# Patient Record
Sex: Male | Born: 1975
Health system: Southern US, Community
[De-identification: ages and names within clinical notes are randomized; demographics above are authoritative.]

## PROBLEM LIST (undated history)

## (undated) DIAGNOSIS — W3400XA Accidental discharge from unspecified firearms or gun, initial encounter: Secondary | ICD-10-CM

## (undated) DIAGNOSIS — F419 Anxiety disorder, unspecified: Secondary | ICD-10-CM

## (undated) DIAGNOSIS — G43909 Migraine, unspecified, not intractable, without status migrainosus: Secondary | ICD-10-CM

## (undated) DIAGNOSIS — K219 Gastro-esophageal reflux disease without esophagitis: Secondary | ICD-10-CM

## (undated) DIAGNOSIS — F319 Bipolar disorder, unspecified: Secondary | ICD-10-CM

## (undated) DIAGNOSIS — F1110 Opioid abuse, uncomplicated: Secondary | ICD-10-CM

## (undated) DIAGNOSIS — F1010 Alcohol abuse, uncomplicated: Secondary | ICD-10-CM

## (undated) HISTORY — DX: Anxiety disorder, unspecified: F41.9

## (undated) HISTORY — DX: Migraine, unspecified, not intractable, without status migrainosus: G43.909

## (undated) HISTORY — PX: APPENDECTOMY: SHX54

## (undated) HISTORY — DX: Accidental discharge from unspecified firearms or gun, initial encounter: W34.00XA

## (undated) HISTORY — DX: Gastro-esophageal reflux disease without esophagitis: K21.9

## (undated) HISTORY — PX: ABDOMINAL SURGERY: SHX537

---

## 2000-06-04 ENCOUNTER — Emergency Department (HOSPITAL_COMMUNITY): Admission: EM | Admit: 2000-06-04 | Discharge: 2000-06-05 | Payer: Self-pay | Admitting: *Deleted

## 2000-06-05 ENCOUNTER — Ambulatory Visit (HOSPITAL_COMMUNITY): Admission: RE | Admit: 2000-06-05 | Discharge: 2000-06-06 | Payer: Self-pay | Admitting: Oral Surgery

## 2000-06-05 ENCOUNTER — Encounter: Payer: Self-pay | Admitting: *Deleted

## 2000-11-10 ENCOUNTER — Encounter: Payer: Self-pay | Admitting: Internal Medicine

## 2000-11-10 ENCOUNTER — Inpatient Hospital Stay (HOSPITAL_COMMUNITY): Admission: EM | Admit: 2000-11-10 | Discharge: 2000-11-14 | Payer: Self-pay | Admitting: Internal Medicine

## 2000-11-12 ENCOUNTER — Encounter: Payer: Self-pay | Admitting: Internal Medicine

## 2002-06-14 ENCOUNTER — Encounter: Payer: Self-pay | Admitting: Emergency Medicine

## 2002-06-14 ENCOUNTER — Emergency Department (HOSPITAL_COMMUNITY): Admission: EM | Admit: 2002-06-14 | Discharge: 2002-06-14 | Payer: Self-pay | Admitting: Emergency Medicine

## 2009-06-30 ENCOUNTER — Emergency Department (HOSPITAL_COMMUNITY): Admission: EM | Admit: 2009-06-30 | Discharge: 2009-07-01 | Payer: Self-pay | Admitting: Emergency Medicine

## 2010-04-18 LAB — GLUCOSE, CAPILLARY: Glucose-Capillary: 115 mg/dL — ABNORMAL HIGH (ref 70–99)

## 2010-04-18 LAB — URINALYSIS, ROUTINE W REFLEX MICROSCOPIC
Glucose, UA: NEGATIVE mg/dL
Hgb urine dipstick: NEGATIVE
Nitrite: NEGATIVE
Specific Gravity, Urine: 1.01 (ref 1.005–1.030)
Urobilinogen, UA: 0.2 mg/dL (ref 0.0–1.0)

## 2010-04-18 LAB — DIFFERENTIAL
Basophils Absolute: 0 10*3/uL (ref 0.0–0.1)
Basophils Relative: 0 % (ref 0–1)
Eosinophils Absolute: 0 10*3/uL (ref 0.0–0.7)
Neutro Abs: 5.8 10*3/uL (ref 1.7–7.7)
Neutrophils Relative %: 71 % (ref 43–77)

## 2010-04-18 LAB — CBC
MCHC: 34.4 g/dL (ref 30.0–36.0)
MCV: 88.6 fL (ref 78.0–100.0)
Platelets: 178 10*3/uL (ref 150–400)
RBC: 4.82 MIL/uL (ref 4.22–5.81)
RDW: 13.7 % (ref 11.5–15.5)

## 2010-04-18 LAB — ETHANOL: Alcohol, Ethyl (B): 50 mg/dL — ABNORMAL HIGH (ref 0–10)

## 2010-04-18 LAB — HEPATIC FUNCTION PANEL
AST: 25 U/L (ref 0–37)
Albumin: 4.2 g/dL (ref 3.5–5.2)
Total Protein: 7.6 g/dL (ref 6.0–8.3)

## 2010-04-18 LAB — BASIC METABOLIC PANEL
BUN: 12 mg/dL (ref 6–23)
CO2: 25 mEq/L (ref 19–32)
Calcium: 9.4 mg/dL (ref 8.4–10.5)
Creatinine, Ser: 1.05 mg/dL (ref 0.4–1.5)
GFR calc Af Amer: >60 mL/min (ref >60)
Glucose, Bld: 101 mg/dL — ABNORMAL HIGH (ref 70–99)

## 2010-04-18 LAB — RAPID URINE DRUG SCREEN, HOSP PERFORMED
Cocaine: NOT DETECTED
Tetrahydrocannabinol: POSITIVE — AB
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
## 2010-06-17 NOTE — Op Note (Signed)
[place]. [facility]  Patient:    Paul Ferguson, Paul Ferguson                    MRN: [id number] Proc. Date: 06/05/00 Adm. Date:  [id number] Attending:  Georgia Lopes                           Operative Report  PREOPERATIVE DIAGNOSIS:  Left mandibular angle fracture.  POSTOPERATIVE DIAGNOSIS:  Left mandibular angle fracture plus nonrestorable tooth #17.  OPERATION:  Open reduction and internal fixation left mandibular angle fracture.  Removal of tooth #17.  SURGEON:  Georgia Lopes, D.M.D.  ANESTHESIA:  General nasal  ANESTHESIOLOGIST:  Edwin Cap. Zoila Shutter, M.D.  DESCRIPTION OF PROCEDURE:  The patient was taken to the operating room and placed on the table in the supine position.  General anesthesia was administered intravenously and nasal endotracheal tube was placed and secured. Eyes were lubricated and protected.  The patient was prepped and draped for the procedure.  Then 0.25% Marcaine and 2% Lidocaine were administered in an inferior alveolar block on the left mandible.  A total of 2 carpules of Lidocaine and 2 carpules of Marcaine were used at 1.8 cc per carpule.  Then arch bars were adapted and placed and ligated to the upper and lower teeth with 24 gauge wires in the posterior and 26 gauge wires in the anterior.  It initially was thought that tooth #17, which was in the proximal segment of the fracture could be used to help secure the proximal segment but it was determined that this tooth was mesioangular in inclination and was partially impacted in the soft tissue when it was in its normal position in the mouth. It was therefore determined that this tooth could not be saved and would not be of help in reducing the fracture.  The tooth was then taken out using a Therapist, nutritional and cow horn forceps.  The socket was then inspected and all of the tooth fragments were removed.  There was a preexisting laceration of the lingula aspect of the soft tissue  at the fracture and this was then sutured with 4-0 chromic gut.  Then the oral cavity was irrigated copiously and the throat pack was removed and intermaxillary fixation was achieved using 24 and 26 gauge wires to wire the upper to the lower jaw using the previously attached arch bars.  Occlusion was satisfactory at this time.  Then attention was turned to the extra oral area of the left mandible.  Gloves were changed and the area was undraped to provide sterile field.  Although this fracture did communicate with the oral solutions via its intraoral exposure through the fracture site.  A marking pen was used to demarcate the inferior border which was palpated and the fracture site which was palpated.  Then 2 carpules of 2% Lidocaine and 1:100,000 epinephrine and 1 carpule of Marcaine was used to infiltrate subcutaneously and down to the bone.  Then a #15 blade was used to incise first through skin and subcutaneous tissue. Bovie electrocautery was used for hemostasis and the platysma muscle was identified and a nerve stimulator was used to detect for the facial nerve.  It was not found in this plane and in this incision.  This muscle was then suctioned with a 15 blade. The dissection was carried deeper until the inferior border of the mandible was found and the fracture was identified.  A  key periosteal elevator and a Therapist, nutritional were used to expose the fracture.  Kocher bone clamps were then used to reduce the fracture and it reduced nicely into anatomical position. Then Osteomed bone plate was used four hole compression plate and was adapted to the fracture site.  Drill was then used under irrigation to create four holes in the mandible and 8 mm screws were placed.  The fracture was satisfactorily reduced at this point and the area was irrigated and then closed with 3-0 Vicryl, 3-0 chromic deep and muscular layers and then a subcuticular suture was used with 3-0 chromic.  The skin was  closed with 5-0 nylon.  Bacitracin was then placed.  Telfa and then Hypafix as then placed on the incision area.  An NG tube was placed.  The oral cavity was inspected and suctioned.  The patient was awakened and taken to the recovery room breathing spontaneously in good condition.  EBL was 50 cc.  Complications were none. Specimens none.  Counts were correct. DD:  06/05/00 TD:  06/06/00 Job: [id number]

## 2010-06-17 NOTE — H&P (Signed)
[facility]  Patient:    Paul Ferguson, Paul Ferguson Visit Number: [id number] MRN: [id number]          Service Type: MED Location: [facility] [id number] 01 Attending Physician:  Cassell Smiles. Dictated by:   Avon Gully, M.D. Admit Date:  11/10/2000                           History and Physical  CHIEF COMPLAINT:  Drug overdose.  HISTORY OF PRESENT ILLNESS:  This is a 35 year old white male with a history of paranoid schizophrenia, brought to the emergency room due to drug overdose. According this his girlfriend, the patient was in his usual state of health until the day of admission when she found him unresponsive and in respiratory distress.  The patient was on Zyprexa for paranoid schizophrenia.  His girlfriend found his Zyprexa bottle, which has 45 tablets of Zyprexa, nearby the patient, empty.  Then EMS was called, and the patient was rushed to the emergency room.  In the emergency room the patient was found to be sedated and unresponsive to verbal stimulus.  He did have a gag reflex, and the decision was made to intubate him.  The patient was then intubated and was admitted to ICU.  REVIEW OF SYSTEMS:  The patient is intubated and sedated.  He is unable to give any history.  PAST MEDICAL HISTORY: 1. Paranoid schizophrenia. 2. History of fall with fracture of mandible.  CURRENT MEDICATIONS:  Zyprexa.  Prozac.  SOCIAL HISTORY:  The patient is single.  He is disabled due to his mental illness.  The patient smokes cigarettes.  He has a history of alcohol on a social basis.  No history of substance abuse.  PHYSICAL EXAMINATION:  GENERAL:  The patient is intubated and sedated.  HEENT:  Pupils equal and reactive.  NECK:  Supple.  LUNGS:  Clear lung fields.  Good air entry.  CARDIOVASCULAR:  First and second heart sounds heard.  No murmur, no gallop.  ABDOMEN:  Soft and flat.  Bowel sounds positive.  No mass, no organomegaly.  EXTREMITIES:  No leg  edema.  ASSESSMENT:  Respiratory distress secondary to drug overdose, most probably Zyprexa.  PLAN:  Will continue the patient on ventilatory support.  Will start patient on IV fluids.  Will monitor his blood gas and adjust his ventilator setting. Will do pulmonary consult.  Will get behavioral medicine consult when the patient is alert and awake. Dictated by:   Avon Gully, M.D. Attending Physician:  Cassell Smiles DD:  11/11/00 TD:  11/11/00 Job: [id number]

## 2010-06-17 NOTE — Group Therapy Note (Signed)
[facility]  Patient:    Paul Ferguson, Paul Ferguson Visit Number: [id number] MRN: [id number]          Service Type: MED Location: [facility] [id number] 01 Attending Physician:  Cassell Smiles. Dictated by:   Kari Baars, M.D. Admit Date:  11/10/2000                               Progress Note  Mr. Vallery is still having some cough. Otherwise he is doing about the same. He has no other new complaints. Apparently plans are under way for him to receive increased psychiatric help possibly as an inpatient. Since he is off the ventilator and has what appears to be a bronchitis, I am going to go ahead and sign off at this point. Thanks again for allowing me to see him with you. Dictated by:   Kari Baars, M.D. Attending Physician:  Cassell Smiles DD:  11/13/00 TD:  11/13/00 Job: [phone] [id number]

## 2010-06-17 NOTE — Discharge Summary (Signed)
[facility]  Patient:    Paul Ferguson, Paul Ferguson Visit Number: [id number] MRN: [id number]          Service Type: MED Location: 2A A220 01 Attending Physician:  Avon Gully Dictated by:   Avon Gully, M.D. Admit Date:  11/10/2000 Discharge Date: 11/14/2000                             Discharge Summary  DISCHARGE DIAGNOSES: 1. Ventilatory dependent respiratory failure. 2. Status post extubation. 3. Drug overdose with Zyprexa. 4. History of paranoid schizophrenia. 5. History of suicidal ideation.  DISPOSITION:  The patient was discharged home in stable condition after arrangement was made to be followed by mental health department on discharge.  HOSPITAL COURSE: This is a 35 year old white male with a history of paranoid schizophrenia.  He was brought to the emergency room due to drug overdose. The patient took an unspecified amount of Zyprexa.  When he was brought to the emergency room he was unconscious and had difficulty breathing.  He was immediately intubated in the emergency room and was put on mechanical ventilation.  The patient was admitted to ICU where he was maintained on mechanical ventilation.  Later on the patient when his overall condition improved the patient was successfully extubated.  Psychiatric consult was made and the patient was evaluated by mental health nurse practitioner who consulted her psychiatrist and determined that the patient could be discharged with a follow up with the Mental Health Department.  The patient was discharged home in stable condition to continue his follow up with the Department of Health. Dictated by:   Avon Gully, M.D. Attending Physician:  Avon Gully DD:  12/04/00 TD:  12/05/00 Job: [id number]

## 2010-06-17 NOTE — Consult Note (Signed)
[facility]  Patient:    Paul Ferguson, Paul Ferguson Visit Number: [id number] MRN: [id number]          Service Type: MED Location: [facility] [id number] 01 Attending Physician:  Cassell Smiles. Dictated by:   Kari Baars, M.D. Admit Date:  11/10/2000                            Consultation Report  PRIMARY CARE PHYSICIAN:  Avon Gully, M.D.  REASON FOR CONSULTATION:  Respiratory failure.  HISTORY OF PRESENT ILLNESS:  This is a 35 year old who has had a history of paranoid schizophrenia and who had been brought in unresponsive of a drug overdose apparently. He had been on zyprexa and apparently had taken at least 45 of these. When he was brought to the emergency room, he was unresponsive, had a poor gag reflex and was intubated. He was then taken to the intensive care unit and since then he has gotten somewhat better. He is really not able to give any history at this point. He does have a history of paranoid schizophrenia. His medications are zyprexa, I am not sure of the dose, and Prozac. He has been disabled because of his mental illness and smokes about a package of cigarettes daily. Apparently there is some alcohol use but I do not know the extent of this.  REVIEW OF SYSTEMS:  Really unobtainable now.  PHYSICAL EXAMINATION:  GENERAL:  He is alert, motions that he wants the endotracheal tube out.  VITAL SIGNS:  His respirations are about 18, blood pressure 122/80, pulse 80.  HEENT:  Shows that he has and endotracheal tube in his mouth. Nose and throat are clear. Pupils are reactive. Tympanic membranes are intact.  NECK:  Supple without masses.  CHEST:  Clear without wheezes, rales or rhonchi.  HEART:  Regular without murmur, gallop or rub.  ABDOMEN:  Soft  EXTREMITIES:  Showed no edema.  CNS:  He is alert and awake.  PLAN:  Go ahead and I think he is probably ready for extubation as Dr. Felecia Shelling has already discussed with me and I plan to follow with  you. It appears that he is going to need further mental health evaluation and treatment. Dictated by:   Kari Baars, M.D. Attending Physician:  Cassell Smiles DD:  11/12/00 TD:  11/13/00 Job: [phone] [id number]

## 2012-03-17 ENCOUNTER — Emergency Department (HOSPITAL_COMMUNITY): Payer: Self-pay

## 2012-03-17 ENCOUNTER — Encounter (HOSPITAL_COMMUNITY): Payer: Self-pay

## 2012-03-17 ENCOUNTER — Emergency Department (HOSPITAL_COMMUNITY)
Admission: EM | Admit: 2012-03-17 | Discharge: 2012-03-17 | Disposition: A | Discharge status: Home / Self Care | Payer: Self-pay | Attending: Emergency Medicine | Admitting: Emergency Medicine

## 2012-03-17 DIAGNOSIS — R5381 Other malaise: Secondary | ICD-10-CM | POA: Insufficient documentation

## 2012-03-17 DIAGNOSIS — F29 Unspecified psychosis not due to a substance or known physiological condition: Secondary | ICD-10-CM | POA: Insufficient documentation

## 2012-03-17 DIAGNOSIS — R5383 Other fatigue: Secondary | ICD-10-CM | POA: Insufficient documentation

## 2012-03-17 DIAGNOSIS — F17200 Nicotine dependence, unspecified, uncomplicated: Secondary | ICD-10-CM | POA: Insufficient documentation

## 2012-03-17 DIAGNOSIS — Z7982 Long term (current) use of aspirin: Secondary | ICD-10-CM | POA: Insufficient documentation

## 2012-03-17 DIAGNOSIS — R209 Unspecified disturbances of skin sensation: Secondary | ICD-10-CM | POA: Insufficient documentation

## 2012-03-17 DIAGNOSIS — R110 Nausea: Secondary | ICD-10-CM | POA: Insufficient documentation

## 2012-03-17 DIAGNOSIS — R569 Unspecified convulsions: Secondary | ICD-10-CM | POA: Insufficient documentation

## 2012-03-17 DIAGNOSIS — R51 Headache: Secondary | ICD-10-CM | POA: Insufficient documentation

## 2012-03-17 DIAGNOSIS — H9319 Tinnitus, unspecified ear: Secondary | ICD-10-CM | POA: Insufficient documentation

## 2012-03-17 DIAGNOSIS — Z8659 Personal history of other mental and behavioral disorders: Secondary | ICD-10-CM | POA: Insufficient documentation

## 2012-03-17 LAB — COMPREHENSIVE METABOLIC PANEL
ALT: 22 U/L (ref 0–53)
AST: 26 U/L (ref 0–37)
Alkaline Phosphatase: 65 U/L (ref 39–117)
CO2: 21 mEq/L (ref 19–32)
Chloride: 102 mEq/L (ref 96–112)
GFR calc non Af Amer: 74 mL/min — ABNORMAL LOW (ref >90)
Potassium: 3.9 mEq/L (ref 3.5–5.1)
Sodium: 137 mEq/L (ref 135–145)
Total Bilirubin: 0.6 mg/dL (ref 0.3–1.2)

## 2012-03-17 LAB — RAPID URINE DRUG SCREEN, HOSP PERFORMED
Amphetamines: NOT DETECTED
Benzodiazepines: POSITIVE — AB
Tetrahydrocannabinol: POSITIVE — AB

## 2012-03-17 LAB — URINALYSIS, ROUTINE W REFLEX MICROSCOPIC
Bilirubin Urine: NEGATIVE
Hgb urine dipstick: NEGATIVE
Protein, ur: NEGATIVE mg/dL
Urobilinogen, UA: 0.2 mg/dL (ref 0.0–1.0)

## 2012-03-17 LAB — CBC WITH DIFFERENTIAL/PLATELET
Basophils Absolute: 0 10*3/uL (ref 0.0–0.1)
HCT: 41.6 % (ref 39.0–52.0)
Lymphocytes Relative: 25 % (ref 12–46)
Monocytes Absolute: 0.6 10*3/uL (ref 0.1–1.0)
Neutro Abs: 6.3 10*3/uL (ref 1.7–7.7)
Platelets: 208 10*3/uL (ref 150–400)
RDW: 13.1 % (ref 11.5–15.5)
WBC: 9.4 10*3/uL (ref 4.0–10.5)

## 2012-03-17 MED ORDER — DIAZEPAM 10 MG RE GEL: 5.0000 mg | Freq: Once | RECTAL | Status: DC

## 2012-03-17 MED ORDER — KETOROLAC TROMETHAMINE 30 MG/ML IJ SOLN
30.0000 mg | Freq: Once | INTRAMUSCULAR | Status: AC
  Administered 2012-03-17: 30 mg via INTRAVENOUS
  Filled 2012-03-17: qty 1

## 2012-03-17 MED ORDER — LEVETIRACETAM 500 MG PO TABS: 500.0000 mg | ORAL_TABLET | Freq: Two times a day (BID) | ORAL | Status: DC

## 2012-03-17 MED ORDER — ACETAMINOPHEN 500 MG PO TABS
1000.0000 mg | ORAL_TABLET | Freq: Once | ORAL | Status: AC
  Administered 2012-03-17: 1000 mg via ORAL
  Filled 2012-03-17: qty 2

## 2012-03-17 MED ORDER — LORAZEPAM 2 MG/ML IJ SOLN
1.0000 mg | Freq: Once | INTRAMUSCULAR | Status: AC
  Administered 2012-03-17: 1 mg via INTRAVENOUS
  Filled 2012-03-17: qty 1

## 2012-03-17 MED ORDER — SODIUM CHLORIDE 0.9 % IV SOLN
500.0000 mg | Freq: Once | INTRAVENOUS | Status: AC
  Administered 2012-03-17: 500 mg via INTRAVENOUS
  Filled 2012-03-17: qty 5
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
## 2012-03-17 NOTE — ED Notes (Signed)
Family states pt had ? Three seizures yesterday and one today

## 2012-03-17 NOTE — ED Notes (Signed)
Sprite and crackers given to pt 

## 2012-03-17 NOTE — ED Notes (Addendum)
Seizure pads placed on bed at pt arrival. SO at bedside.

## 2012-03-17 NOTE — ED Provider Notes (Signed)
37 year old male with past medical history significant for bipolar and anxiety who was treated with benzodiazepines as of September. He has not had his medicines in many months, he drinks alcohol occasionally, he did drink some alcohol last night and had a Klonopin last night. His girlfriend with whom he was spending the night with states that he had 3 discreet seizure-like episodes last night, one this morning. Each of these was punctuated by complete arm and leg body shaking and tightness almost as if he was in a locked position as well as biting of the right lower lip and a postictal phase lasting 30 minutes or more. His last episode was this morning, he has not had any urinary incontinence, he does have a significant headache which is diffuse at this time. The patient denies any other prescription or over-the-counter medications other than the occasional aspirin, there has been no head injuries other than a very small abrasion to his right face after he fell during a seizure last night. He denies numbness or weakness of his arms or legs, he was able to ambulate in the hospital. The patient and his girlfriend state that they have a child that had epilepsy which has since passed away.  On exam the patient appears mildly somnolent but easily arousable, follows commands, speech is clear, memory is intact except for the episodes of seizure activity, strength is normal in all 4 extremities, cranial nerves III through XII are intact, no pronator drift.  Assessment: The patient has had what appears to be a new seizure disorder, cause unspecified at this time. He will require workup including CT scan of the head, laboratory evaluation.  Labs negative, CT negative - Neuro never responded to page - pt stable for d/c - girlfriend states will ensure that he gets the f/u, keppra given in ED, home with same and diastat for breakthrough.  Medical screening examination/treatment/procedure(s) were conducted as a shared  visit with non-physician practitioner(s) and myself.  I personally evaluated the patient during the encounter    Vida Roller, MD 03/17/12 [phone]

## 2012-03-19 NOTE — ED Provider Notes (Signed)
Medical screening examination/treatment/procedure(s) were conducted as a shared visit with non-physician practitioner(s) and myself.  I personally evaluated the patient during the encounter  Please see my separate respective documentation pertaining to this patient encounter   Vida Roller, MD 03/19/12 [phone]

## 2013-08-14 ENCOUNTER — Encounter (HOSPITAL_COMMUNITY): Payer: Self-pay | Admitting: Emergency Medicine

## 2013-08-14 ENCOUNTER — Emergency Department (HOSPITAL_COMMUNITY)
Admission: EM | Admit: 2013-08-14 | Discharge: 2013-08-14 | Disposition: A | Discharge status: Home / Self Care | Payer: No Typology Code available for payment source | Attending: Emergency Medicine | Admitting: Emergency Medicine

## 2013-08-14 ENCOUNTER — Emergency Department (HOSPITAL_COMMUNITY): Payer: No Typology Code available for payment source

## 2013-08-14 DIAGNOSIS — I951 Orthostatic hypotension: Secondary | ICD-10-CM

## 2013-08-14 DIAGNOSIS — R42 Dizziness and giddiness: Secondary | ICD-10-CM | POA: Insufficient documentation

## 2013-08-14 DIAGNOSIS — R519 Headache, unspecified: Secondary | ICD-10-CM

## 2013-08-14 DIAGNOSIS — R51 Headache: Secondary | ICD-10-CM | POA: Insufficient documentation

## 2013-08-14 DIAGNOSIS — F17200 Nicotine dependence, unspecified, uncomplicated: Secondary | ICD-10-CM | POA: Insufficient documentation

## 2013-08-14 DIAGNOSIS — R55 Syncope and collapse: Secondary | ICD-10-CM | POA: Insufficient documentation

## 2013-08-14 DIAGNOSIS — Z7982 Long term (current) use of aspirin: Secondary | ICD-10-CM | POA: Insufficient documentation

## 2013-08-14 LAB — CBC WITH DIFFERENTIAL/PLATELET
BASOS PCT: 0 % (ref 0–1)
Basophils Absolute: 0 10*3/uL (ref 0.0–0.1)
EOS PCT: 1 % (ref 0–5)
Eosinophils Absolute: 0.1 10*3/uL (ref 0.0–0.7)
HEMATOCRIT: 40.1 % (ref 39.0–52.0)
HEMOGLOBIN: 13.6 g/dL (ref 13.0–17.0)
Lymphocytes Relative: 24 % (ref 12–46)
Lymphs Abs: 1.9 10*3/uL (ref 0.7–4.0)
MCH: 30.8 pg (ref 26.0–34.0)
MCHC: 33.9 g/dL (ref 30.0–36.0)
MCV: 90.7 fL (ref 78.0–100.0)
MONO ABS: 0.7 10*3/uL (ref 0.1–1.0)
MONOS PCT: 9 % (ref 3–12)
NEUTROS ABS: 5 10*3/uL (ref 1.7–7.7)
Neutrophils Relative %: 66 % (ref 43–77)
Platelets: 197 10*3/uL (ref 150–400)
RBC: 4.42 MIL/uL (ref 4.22–5.81)
RDW: 13.6 % (ref 11.5–15.5)
WBC: 7.7 10*3/uL (ref 4.0–10.5)

## 2013-08-14 LAB — BASIC METABOLIC PANEL
Anion gap: 14 (ref 5–15)
BUN: 13 mg/dL (ref 6–23)
CALCIUM: 9 mg/dL (ref 8.4–10.5)
CHLORIDE: 106 meq/L (ref 96–112)
CO2: 23 meq/L (ref 19–32)
CREATININE: 1.01 mg/dL (ref 0.50–1.35)
GFR calc non Af Amer: >90 mL/min (ref >90)
Glucose, Bld: 103 mg/dL — ABNORMAL HIGH (ref 70–99)
Potassium: 4.3 mEq/L (ref 3.7–5.3)
Sodium: 143 mEq/L (ref 137–147)

## 2013-08-14 LAB — ETHANOL: Alcohol, Ethyl (B): <11 mg/dL (ref 0–11)

## 2013-08-14 MED ORDER — SODIUM CHLORIDE 0.9 % IV SOLN: 1000.0000 mL | INTRAVENOUS | Status: DC

## 2013-08-14 MED ORDER — SODIUM CHLORIDE 0.9 % IV SOLN
1000.0000 mL | Freq: Once | INTRAVENOUS | Status: AC
  Administered 2013-08-14: 1000 mL via INTRAVENOUS

## 2013-08-14 MED ORDER — MECLIZINE HCL 12.5 MG PO TABS
25.0000 mg | ORAL_TABLET | Freq: Once | ORAL | Status: AC
  Administered 2013-08-14: 25 mg via ORAL
  Filled 2013-08-14: qty 2

## 2013-08-14 MED ORDER — DIPHENHYDRAMINE HCL 50 MG/ML IJ SOLN
25.0000 mg | Freq: Once | INTRAMUSCULAR | Status: AC
  Administered 2013-08-14: 25 mg via INTRAVENOUS
  Filled 2013-08-14: qty 1

## 2013-08-14 MED ORDER — MECLIZINE HCL 25 MG PO TABS: 25.0000 mg | ORAL_TABLET | Freq: Three times a day (TID) | ORAL | Status: AC | PRN

## 2013-08-14 MED ORDER — METOCLOPRAMIDE HCL 5 MG/ML IJ SOLN
10.0000 mg | Freq: Once | INTRAMUSCULAR | Status: AC
  Administered 2013-08-14: 10 mg via INTRAVENOUS
  Filled 2013-08-14: qty 2

## 2013-08-14 NOTE — Discharge Instructions (Signed)
Your CAT scan did not show any new injury. You may take acetaminophen or ibuprofen as needed for pain. I recommend that she get the prescriptions filled that you were given from [facility].  Dizziness Dizziness is a common problem. It is a feeling of unsteadiness or light-headedness. You may feel like you are about to faint. Dizziness can lead to injury if you stumble or fall. A person of any age group can suffer from dizziness, but dizziness is more common in older adults. CAUSES  Dizziness can be caused by many different things, including:  Middle ear problems.  Standing for too long.  Infections.  An allergic reaction.  Aging.  An emotional response to something, such as the sight of blood.  Side effects of medicines.  Tiredness.  Problems with circulation or blood pressure.  Excessive use of alcohol or medicines, or illegal drug use.  Breathing too fast (hyperventilation).  An irregular heart rhythm (arrhythmia).  A low red blood cell count (anemia).  Pregnancy.  Vomiting, diarrhea, fever, or other illnesses that cause body fluid loss (dehydration).  Diseases or conditions such as Parkinson's disease, high blood pressure (hypertension), diabetes, and thyroid problems.  Exposure to extreme heat. DIAGNOSIS  Your health care provider will ask about your symptoms, perform a physical exam, and perform an electrocardiogram (ECG) to record the electrical activity of your heart. Your health care provider may also perform other heart or blood tests to determine the cause of your dizziness. These may include:  Transthoracic echocardiogram (TTE). During echocardiography, sound waves are used to evaluate how blood flows through your heart.  Transesophageal echocardiogram (TEE).  Cardiac monitoring. This allows your health care provider to monitor your heart rate and rhythm in real time.  Holter monitor. This is a portable device that records your heartbeat and can help  diagnose heart arrhythmias. It allows your health care provider to track your heart activity for several days if needed.  Stress tests by exercise or by giving medicine that makes the heart beat faster. TREATMENT  Treatment of dizziness depends on the cause of your symptoms and can vary greatly. HOME CARE INSTRUCTIONS   Drink enough fluids to keep your urine clear or pale yellow. This is especially important in very hot weather. In older adults, it is also important in cold weather.  Take your medicine exactly as directed if your dizziness is caused by medicines. When taking blood pressure medicines, it is especially important to get up slowly.  Rise slowly from chairs and steady yourself until you feel okay.  In the morning, first sit up on the side of the bed. When you feel okay, stand slowly while holding onto something until you know your balance is fine.  Move your legs often if you need to stand in one place for a long time. Tighten and relax your muscles in your legs while standing.  Have someone stay with you for 1-2 days if dizziness continues to be a problem. Do this until you feel you are well enough to stay alone. Have the person call your health care provider if he or she notices changes in you that are concerning.  Do not drive or use heavy machinery if you feel dizzy.  Do not drink alcohol. SEEK IMMEDIATE MEDICAL CARE IF:   Your dizziness or light-headedness gets worse.  You feel nauseous or vomit.  You have problems talking, walking, or using your arms, hands, or legs.  You feel weak.  You are not thinking clearly or  you have trouble forming sentences. It may take a friend or family member to notice this.  You have chest pain, abdominal pain, shortness of breath, or sweating.  Your vision changes.  You notice any bleeding.  You have side effects from medicine that seems to be getting worse rather than better. MAKE SURE YOU:   Understand these  instructions.  Will watch your condition.  Will get help right away if you are not doing well or get worse. Document Released: 07/12/2000 Document Revised: 01/21/2013 Document Reviewed: 08/05/2010 [facility] Patient Information 2015 [place], [place]. This information is not intended to replace advice given to you by your health care provider. Make sure you discuss any questions you have with your health care provider.  Meclizine tablets or capsules What is this medicine? MECLIZINE (MEK li zeen) is an antihistamine. It is used to prevent nausea, vomiting, or dizziness caused by motion sickness. It is also used to prevent and treat vertigo (extreme dizziness or a feeling that you or your surroundings are tilting or spinning around). This medicine may be used for other purposes; ask your health care provider or pharmacist if you have questions. COMMON BRAND NAME(S): Antivert, Dramamine Less Drowsy, Medivert, Meni-D What should I tell my health care provider before I take this medicine? They need to know if you have any of these conditions: -asthma -glaucoma -prostate trouble -stomach problems -urinary problems -an unusual or allergic reaction to meclizine, other medicines, foods, dyes, or preservatives -pregnant or trying to get pregnant -breast-feeding How should I use this medicine? Take this medicine by mouth with a glass of water. Follow the directions on the prescription label. If you are using this medicine to prevent motion sickness, take the dose at least 1 hour before travel. If it upsets your stomach, take it with food or milk. Take your doses at regular intervals. Do not take your medicine more often than directed. Talk to your pediatrician regarding the use of this medicine in children. Special care may be needed. Overdosage: If you think you have taken too much of this medicine contact a poison control center or emergency room at once. NOTE: This medicine is only for you. Do not  share this medicine with others. What if I miss a dose? If you miss a dose, take it as soon as you can. If it is almost time for your next dose, take only that dose. Do not take double or extra doses. What may interact with this medicine? -barbiturate medicines for inducing sleep or treating seizures -digoxin -medicines for anxiety or sleeping problems, like alprazolam, diazepam or temazepam -medicines for hay fever and other allergies -medicines for mental depression -medicines for movement abnormalities as in Parkinson's disease, or for stomach problems -medicines for pain -medicines that relax muscles This list may not describe all possible interactions. Give your health care provider a list of all the medicines, herbs, non-prescription drugs, or dietary supplements you use. Also tell them if you smoke, drink alcohol, or use illegal drugs. Some items may interact with your medicine. What should I watch for while using this medicine? If you are taking this medicine on a regular schedule, visit your doctor or health care professional for regular checks on your progress. You may get dizzy, drowsy or have blurred vision. Do not drive, use machinery, or do anything that needs mental alertness until you know how this medicine affects you. Do not stand or sit up quickly, especially if you are an older patient. This reduces the risk  of dizzy or fainting spells. Alcohol can increase possible dizziness. Avoid alcoholic drinks. Your mouth may get dry. Chewing sugarless gum or sucking hard candy, and drinking plenty of water may help. Contact your doctor if the problem does not go away or is severe. This medicine may cause dry eyes and blurred vision. If you wear contact lenses you may feel some discomfort. Lubricating drops may help. See your eye doctor if the problem does not go away or is severe. What side effects may I notice from receiving this medicine? Side effects that you should report to your  doctor or health care professional as soon as possible: -fainting spells -fast or irregular heartbeat Side effects that usually do not require medical attention (report to your doctor or health care professional if they continue or are bothersome): -constipation -difficulty passing urine -difficulty sleeping -headache -stomach upset This list may not describe all possible side effects. Call your doctor for medical advice about side effects. You may report side effects to FDA at 1-800-FDA-1088. Where should I keep my medicine? Keep out of the reach of children. Store at room temperature between 15 and 30 degrees C (59 and 86 degrees F). Keep container tightly closed. Throw away any unused medicine after the expiration date. NOTE: This sheet is a summary. It may not cover all possible information. If you have questions about this medicine, talk to your doctor, pharmacist, or health care provider.  2015, Elsevier/Gold Standard. (2007-07-25 10:35:36)
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
## 2013-08-14 NOTE — ED Notes (Signed)
Patient with no complaints at this time. Respirations even and unlabored. Skin warm/dry. Discharge instructions reviewed with patient at this time. Patient given opportunity to voice concerns/ask questions. IV removed per policy and band-aid applied to site. Patient discharged at this time and left Emergency Department with steady gait.  

## 2013-08-14 NOTE — ED Notes (Signed)
Patient appears anxious and agitated. Shaking. States these symptoms started this AM. Hypertensive.

## 2016-04-25 ENCOUNTER — Emergency Department (HOSPITAL_COMMUNITY)
Admission: EM | Admit: 2016-04-25 | Discharge: 2016-04-25 | Disposition: A | Discharge status: Home / Self Care | Payer: Self-pay | Attending: Dermatology | Admitting: Dermatology

## 2016-04-25 DIAGNOSIS — Z5321 Procedure and treatment not carried out due to patient leaving prior to being seen by health care provider: Secondary | ICD-10-CM | POA: Insufficient documentation

## 2016-04-25 DIAGNOSIS — L0291 Cutaneous abscess, unspecified: Secondary | ICD-10-CM | POA: Insufficient documentation

## 2016-04-25 DIAGNOSIS — F17200 Nicotine dependence, unspecified, uncomplicated: Secondary | ICD-10-CM | POA: Insufficient documentation

## 2016-04-25 NOTE — ED Notes (Signed)
Called for triage for second time. No patients in the waiting room or bathroom. Registration does not see patient.

## 2016-10-06 ENCOUNTER — Other Ambulatory Visit: Payer: Self-pay

## 2016-10-06 ENCOUNTER — Emergency Department (HOSPITAL_COMMUNITY): Payer: Medicare Other

## 2016-10-06 ENCOUNTER — Emergency Department (HOSPITAL_COMMUNITY)
Admission: EM | Admit: 2016-10-06 | Discharge: 2016-10-07 | Discharge status: Against Medical Advice | Payer: Medicare Other | Attending: Emergency Medicine | Admitting: Emergency Medicine

## 2016-10-06 ENCOUNTER — Encounter (HOSPITAL_COMMUNITY): Payer: Self-pay | Admitting: Emergency Medicine

## 2016-10-06 DIAGNOSIS — R079 Chest pain, unspecified: Secondary | ICD-10-CM | POA: Diagnosis not present

## 2016-10-06 DIAGNOSIS — R0602 Shortness of breath: Secondary | ICD-10-CM | POA: Diagnosis not present

## 2016-10-06 DIAGNOSIS — R0789 Other chest pain: Secondary | ICD-10-CM | POA: Diagnosis not present

## 2016-10-06 DIAGNOSIS — Z5320 Procedure and treatment not carried out because of patient's decision for unspecified reasons: Secondary | ICD-10-CM | POA: Insufficient documentation

## 2016-10-06 DIAGNOSIS — F17200 Nicotine dependence, unspecified, uncomplicated: Secondary | ICD-10-CM | POA: Insufficient documentation

## 2016-10-06 MED ORDER — LORAZEPAM 1 MG PO TABS
1.0000 mg | ORAL_TABLET | Freq: Once | ORAL | Status: AC
  Administered 2016-10-06: 1 mg via ORAL
  Filled 2016-10-06: qty 1

## 2016-10-06 MED ORDER — ASPIRIN 81 MG PO CHEW
324.0000 mg | CHEWABLE_TABLET | Freq: Once | ORAL | Status: AC
  Administered 2016-10-06: 324 mg via ORAL
  Filled 2016-10-06: qty 4

## 2016-10-06 MED ORDER — KETOROLAC TROMETHAMINE 30 MG/ML IJ SOLN
30.0000 mg | Freq: Once | INTRAMUSCULAR | Status: AC
  Administered 2016-10-07: 30 mg via INTRAVENOUS
  Filled 2016-10-06: qty 1

## 2016-10-06 NOTE — ED Triage Notes (Signed)
Pt c/o left chest pain that radiates down left arm x 2 days.

## 2016-10-06 NOTE — ED Notes (Signed)
Pt states he does not need to urinate at this time.  

## 2016-10-06 NOTE — ED Provider Notes (Signed)
[facility]-EMERGENCY DEPT Provider Note   CSN: [id number] Arrival date & time: 10/06/16  2256     History   Chief Complaint Chief Complaint  Patient presents with  . Chest Pain    HPI Paul Ferguson is a 41 y.o. male.  Patient reports left-sided chest pain that has been intermittent over the past 3 days. States this pain came on tonight about 8 PM after he woke up from a nap. Pain is sharp, constant, radiates down his left arm. It is worse when he moves his left arm across his chest. It is better when he places his hand on his chest. He did not take anything for it at home. He feels short of breath with it but denies any nausea, vomiting or diaphoresis.He states he's had this intermittent pain 2 days ago as well as a last of the majority of the day. It improved when a friend gave him a "nerve pill". But the pain returned again tonight. Denies any cardiac history. He is smoker. Does not take any chronic medications. Denies any cocaine abuse but does use marijuana.   The history is provided by the patient.  Chest Pain   Associated symptoms include shortness of breath. Pertinent negatives include no abdominal pain, no dizziness, no fever, no headaches, no nausea, no vomiting and no weakness.    History reviewed. No pertinent past medical history.  There are no active problems to display for this patient.   Past Surgical History:  Procedure Laterality Date  . ABDOMINAL SURGERY    . APPENDECTOMY         Home Medications    Prior to Admission medications   Medication Sig Start Date End Date Taking? Authorizing Provider  acetaminophen (TYLENOL) 500 MG tablet Take 500 mg by mouth every 6 (six) hours as needed.    [provider]  meclizine (ANTIVERT) 25 MG tablet Take 1 tablet (25 mg total) by mouth 3 (three) times daily as needed for dizziness. 08/14/13   Dione Booze, MD    Family History No family history on file.  Social History Social History  Substance Use  Topics  . Smoking status: Current Every Day Smoker  . Smokeless tobacco: Never Used  . Alcohol use Yes     Allergies   Fish allergy   Review of Systems Review of Systems  Constitutional: Negative for activity change, appetite change and fever.  HENT: Negative for congestion and rhinorrhea.   Eyes: Negative for visual disturbance.  Respiratory: Positive for chest tightness and shortness of breath.   Cardiovascular: Positive for chest pain.  Gastrointestinal: Negative for abdominal pain, nausea and vomiting.  Genitourinary: Negative for testicular pain and urgency.  Musculoskeletal: Negative for arthralgias and myalgias.  Skin: Negative for rash.  Neurological: Negative for dizziness, weakness, light-headedness and headaches.   all other systems are negative except as noted in the HPI and PMH.     Physical Exam Updated Vital Signs BP (!) 143/106   Pulse 87   Temp 97.9 F (36.6 C)   Resp (!) 22   Ht  (1.803 m)   Wt 88.5 kg (195 lb)   SpO2 97%   BMI 27.20 kg/m   Physical Exam  Constitutional: He is oriented to person, place, and time. He appears well-developed and well-nourished. He appears distressed.  Uncomfortable, clutching chest with hand  HENT:  Head: Normocephalic and atraumatic.  Mouth/Throat: Oropharynx is clear and moist. No oropharyngeal exudate.  Eyes: Pupils are equal, round, and reactive  to light. Conjunctivae and EOM are normal.  Neck: Normal range of motion. Neck supple.  No meningismus.  Cardiovascular: Normal rate, regular rhythm, normal heart sounds and intact distal pulses.   No murmur heard. Intact radial pulses and grip strengths  Pulmonary/Chest: Effort normal and breath sounds normal. No respiratory distress. He exhibits no tenderness.  States his chest is sore to palpation but this does not reproduce his pain.  Abdominal: Soft. There is no tenderness. There is no rebound and no guarding.  Musculoskeletal: Normal range of motion. He  exhibits no edema or tenderness.  Chest pain worse with flexion of the left arm across body  Neurological: He is alert and oriented to person, place, and time. No cranial nerve deficit. He exhibits normal muscle tone. Coordination normal.   5/5 strength throughout. CN 2-12 intact.Equal grip strength.   Skin: Skin is warm.  Psychiatric: He has a normal mood and affect. His behavior is normal.  Nursing note and vitals reviewed.    ED Treatments / Results  Labs (all labs ordered are listed, but only abnormal results are displayed) Labs Reviewed  BASIC METABOLIC PANEL - Abnormal; Notable for the following:       Result Value   Sodium 146 (*)    Creatinine, Ser 1.29 (*)    All other components within normal limits  D-DIMER, QUANTITATIVE (NOT AT [facility]) - Abnormal; Notable for the following:    D-Dimer, Quant 0.91 (*)    All other components within normal limits  ETHANOL - Abnormal; Notable for the following:    Alcohol, Ethyl (B) 19 (*)    All other components within normal limits  CBC  TROPONIN I  TROPONIN I    EKG  EKG Interpretation  Date/Time:  Friday October 06 2016 23:02:51 EDT Ventricular Rate:  90 PR Interval:  136 QRS Duration: 82 QT Interval:  346 QTC Calculation: 423 R Axis:   17 Text Interpretation:  Normal sinus rhythm Nonspecific T wave abnormality Abnormal ECG Nonspecific T wave abnormality Confirmed by Glynn Octaveancour, Olanda Downie [phone]([id number]) on 10/06/2016 11:05:29 PM       Radiology Dg Chest 2 View  Result Date: 10/06/2016 CLINICAL DATA:  left chest pain that radiates down left arm x 2 days, sob, non productive cough x 1 week. No prior history. EXAM: CHEST  2 VIEW COMPARISON:  None. FINDINGS: Normal heart size and pulmonary vascularity. No focal airspace disease or consolidation in the lungs. No blunting of costophrenic angles. No pneumothorax. Mediastinal contours appear intact. Old resection or resorption of the distal left clavicle. IMPRESSION: No active cardiopulmonary  disease. Electronically Signed   By: Burman NievesWilliam  Stevens M.D.   On: 10/06/2016 23:24   Ct Angio Chest Pe W And/or Wo Contrast  Result Date: 10/07/2016 CLINICAL DATA:  Left chest pain radiating down the left arm for 2 days. Positive D-dimer. Intermediate clinical probability for pulmonary embolus. EXAM: CT ANGIOGRAPHY CHEST WITH CONTRAST TECHNIQUE: Multidetector CT imaging of the chest was performed using the standard protocol during bolus administration of intravenous contrast. Multiplanar CT image reconstructions and MIPs were obtained to evaluate the vascular anatomy. CONTRAST:  100 mL Isovue 370 COMPARISON:  None. FINDINGS: Cardiovascular: Satisfactory opacification of the pulmonary arteries to the segmental level. No evidence of pulmonary embolism. Normal heart size. No pericardial effusion. No aortic dissection. Mediastinum/Nodes: Scattered mediastinal and axillary lymph nodes are not pathologically enlarged. Esophagus is decompressed. Thyroid gland is unremarkable. Lungs/Pleura: Lungs are clear. No airspace disease or consolidation. No pleural effusions. No pneumothorax. Upper  Abdomen: No acute abnormality. Musculoskeletal: Healing fractures are demonstrated in the left anterior third, fourth, fifth, and sixth ribs. Fracture lines remain visible. No displacement. Review of the MIP images confirms the above findings. IMPRESSION: 1. No evidence of significant pulmonary embolus. 2. Healing fractures demonstrated in the left anterior third, fourth, fifth, and sixth ribs. No pneumothorax. Electronically Signed   By: Burman Nieves M.D.   On: 10/07/2016 02:00    Procedures Procedures (including critical care time)  Medications Ordered in ED Medications  aspirin chewable tablet 324 mg (not administered)  ketorolac (TORADOL) 30 MG/ML injection 30 mg (not administered)     Initial Impression / Assessment and Plan / ED Course  I have reviewed the triage vital signs and the nursing notes.  Pertinent  labs & imaging results that were available during my care of the patient were reviewed by me and considered in my medical decision making (see chart for details).    L sided chest pain with SOB since 8 pm tonight.  EKG nonischemic. Pain somewhat worse with palpation and L arm movement.   ASA, GI cocktail, toradol given.  Ativan given for anxiety.. States drinks 3-4 beers daily. Denies cocaine use adamantly.  Troponin negative. D-dimer positive. No improvement in pain with morphine, toradol, ativan, GI cocktail. CT negative for PE or aortic dissection.   Pain seems atypical for ACS. Patient continues to ask for pain medication but is resting comfortably watching TV. He has declined to provide a urine sample despite being asked multiple times.  Second troponin negative.  Informed by nurse that patient left AMA while I was with a critical patient. Concern for possible cocaine related chest pain given patient's hesitation to provide urine.  I was not able to speak with patient before he left AMA.   Final Clinical Impressions(s) / ED Diagnoses   Final diagnoses:  Nonspecific chest pain    New Prescriptions New Prescriptions   No medications on file     Glynn Octave, MD 10/07/16 0930

## 2016-10-07 ENCOUNTER — Other Ambulatory Visit: Payer: Self-pay

## 2016-10-07 ENCOUNTER — Encounter (HOSPITAL_COMMUNITY): Payer: Self-pay

## 2016-10-07 ENCOUNTER — Emergency Department (HOSPITAL_COMMUNITY): Payer: Medicare Other

## 2016-10-07 LAB — CBC
HCT: 43.3 % (ref 39.0–52.0)
Hemoglobin: 14.2 g/dL (ref 13.0–17.0)
MCH: 29.3 pg (ref 26.0–34.0)
MCHC: 32.8 g/dL (ref 30.0–36.0)
MCV: 89.5 fL (ref 78.0–100.0)
Platelets: 252 10*3/uL (ref 150–400)
RBC: 4.84 MIL/uL (ref 4.22–5.81)
RDW: 14 % (ref 11.5–15.5)
WBC: 10 10*3/uL (ref 4.0–10.5)

## 2016-10-07 LAB — D-DIMER, QUANTITATIVE: D-Dimer, Quant: 0.91 ug/mL-FEU — ABNORMAL HIGH (ref 0.00–0.50)

## 2016-10-07 LAB — BASIC METABOLIC PANEL
Anion gap: 10 (ref 5–15)
BUN: 13 mg/dL (ref 6–20)
CHLORIDE: 110 mmol/L (ref 101–111)
CO2: 26 mmol/L (ref 22–32)
Calcium: 9.4 mg/dL (ref 8.9–10.3)
Creatinine, Ser: 1.29 mg/dL — ABNORMAL HIGH (ref 0.61–1.24)
GFR calc Af Amer: >60 mL/min (ref >60)
GFR calc non Af Amer: >60 mL/min (ref >60)
Glucose, Bld: 84 mg/dL (ref 65–99)
Potassium: 4 mmol/L (ref 3.5–5.1)
Sodium: 146 mmol/L — ABNORMAL HIGH (ref 135–145)

## 2016-10-07 LAB — TROPONIN I

## 2016-10-07 LAB — ETHANOL: ALCOHOL ETHYL (B): 19 mg/dL — AB (ref <5)

## 2016-10-07 MED ORDER — LORAZEPAM 2 MG/ML IJ SOLN
INTRAMUSCULAR | Status: AC
  Filled 2016-10-07: qty 1

## 2016-10-07 MED ORDER — LORAZEPAM 2 MG/ML IJ SOLN
1.0000 mg | Freq: Once | INTRAMUSCULAR | Status: AC
  Administered 2016-10-07: 1 mg via INTRAVENOUS

## 2016-10-07 MED ORDER — LORAZEPAM 1 MG PO TABS
1.0000 mg | ORAL_TABLET | Freq: Once | ORAL | Status: AC
  Administered 2016-10-07: 1 mg via ORAL
  Filled 2016-10-07: qty 1

## 2016-10-07 MED ORDER — OXYCODONE-ACETAMINOPHEN 5-325 MG PO TABS
1.0000 | ORAL_TABLET | Freq: Once | ORAL | Status: AC
  Administered 2016-10-07: 1 via ORAL
  Filled 2016-10-07: qty 1

## 2016-10-07 MED ORDER — MORPHINE SULFATE (PF) 4 MG/ML IV SOLN
4.0000 mg | Freq: Once | INTRAVENOUS | Status: AC
  Administered 2016-10-07: 4 mg via INTRAVENOUS
  Filled 2016-10-07: qty 1

## 2016-10-07 MED ORDER — IOPAMIDOL (ISOVUE-370) INJECTION 76%
100.0000 mL | Freq: Once | INTRAVENOUS | Status: AC | PRN
  Administered 2016-10-07: 100 mL via INTRAVENOUS

## 2016-10-07 MED ORDER — GI COCKTAIL ~~LOC~~
30.0000 mL | Freq: Once | ORAL | Status: AC
  Administered 2016-10-07: 30 mL via ORAL
  Filled 2016-10-07: qty 30

## 2016-10-07 NOTE — ED Notes (Signed)
Explain to patient that we need a UA sample. "What are testing for crack cocaine?" Explain that the test covers others as well. Pt left upset did not want to give Urine spec. Would not sign AMA form.

## 2017-02-15 DIAGNOSIS — F319 Bipolar disorder, unspecified: Secondary | ICD-10-CM | POA: Diagnosis not present

## 2017-02-15 DIAGNOSIS — F419 Anxiety disorder, unspecified: Secondary | ICD-10-CM | POA: Diagnosis not present

## 2017-02-15 DIAGNOSIS — M25569 Pain in unspecified knee: Secondary | ICD-10-CM | POA: Diagnosis not present

## 2017-02-15 DIAGNOSIS — Z6830 Body mass index (BMI) 30.0-30.9, adult: Secondary | ICD-10-CM | POA: Diagnosis not present

## 2017-02-15 DIAGNOSIS — G894 Chronic pain syndrome: Secondary | ICD-10-CM | POA: Diagnosis not present

## 2017-02-22 ENCOUNTER — Ambulatory Visit (INDEPENDENT_AMBULATORY_CARE_PROVIDER_SITE_OTHER): Payer: Medicare Other | Admitting: Orthopaedic Surgery

## 2017-02-22 ENCOUNTER — Ambulatory Visit (INDEPENDENT_AMBULATORY_CARE_PROVIDER_SITE_OTHER): Payer: Medicare Other

## 2017-02-22 ENCOUNTER — Encounter (INDEPENDENT_AMBULATORY_CARE_PROVIDER_SITE_OTHER): Payer: Self-pay | Admitting: Orthopaedic Surgery

## 2017-02-22 VITALS — BP 148/88 | HR 92 | Ht 71.0 in | Wt 219.0 lb

## 2017-02-22 DIAGNOSIS — M25561 Pain in right knee: Secondary | ICD-10-CM

## 2017-02-22 MED ORDER — LIDOCAINE HCL 1 % IJ SOLN
0.5000 mL | INTRAMUSCULAR | Status: AC | PRN
  Administered 2017-02-22: .5 mL

## 2017-02-22 MED ORDER — BUPIVACAINE HCL 0.25 % IJ SOLN
4.0000 mL | INTRAMUSCULAR | Status: AC | PRN
  Administered 2017-02-22: 4 mL via INTRA_ARTICULAR

## 2017-02-22 MED ORDER — METHYLPREDNISOLONE ACETATE 40 MG/ML IJ SUSP
40.0000 mg | INTRAMUSCULAR | Status: AC | PRN
  Administered 2017-02-22: 40 mg via INTRA_ARTICULAR
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
## 2017-02-26 DIAGNOSIS — F319 Bipolar disorder, unspecified: Secondary | ICD-10-CM | POA: Diagnosis not present

## 2017-02-26 DIAGNOSIS — F419 Anxiety disorder, unspecified: Secondary | ICD-10-CM | POA: Diagnosis not present

## 2017-02-26 DIAGNOSIS — G894 Chronic pain syndrome: Secondary | ICD-10-CM | POA: Diagnosis not present

## 2017-02-26 DIAGNOSIS — Z79899 Other long term (current) drug therapy: Secondary | ICD-10-CM | POA: Diagnosis not present

## 2017-02-26 DIAGNOSIS — R5381 Other malaise: Secondary | ICD-10-CM | POA: Diagnosis not present

## 2017-03-01 DIAGNOSIS — Z7182 Exercise counseling: Secondary | ICD-10-CM | POA: Diagnosis not present

## 2017-03-01 DIAGNOSIS — Z0000 Encounter for general adult medical examination without abnormal findings: Secondary | ICD-10-CM | POA: Diagnosis not present

## 2017-03-01 DIAGNOSIS — F419 Anxiety disorder, unspecified: Secondary | ICD-10-CM | POA: Diagnosis not present

## 2017-03-01 DIAGNOSIS — Z713 Dietary counseling and surveillance: Secondary | ICD-10-CM | POA: Diagnosis not present

## 2017-03-01 DIAGNOSIS — Z6830 Body mass index (BMI) 30.0-30.9, adult: Secondary | ICD-10-CM | POA: Diagnosis not present

## 2017-03-01 DIAGNOSIS — G894 Chronic pain syndrome: Secondary | ICD-10-CM | POA: Diagnosis not present

## 2017-03-01 DIAGNOSIS — E782 Mixed hyperlipidemia: Secondary | ICD-10-CM | POA: Diagnosis not present

## 2017-04-06 DIAGNOSIS — K219 Gastro-esophageal reflux disease without esophagitis: Secondary | ICD-10-CM | POA: Diagnosis not present

## 2017-04-06 DIAGNOSIS — F3160 Bipolar disorder, current episode mixed, unspecified: Secondary | ICD-10-CM | POA: Diagnosis not present

## 2017-08-21 DIAGNOSIS — M25569 Pain in unspecified knee: Secondary | ICD-10-CM | POA: Diagnosis not present

## 2017-08-21 DIAGNOSIS — G894 Chronic pain syndrome: Secondary | ICD-10-CM | POA: Diagnosis not present

## 2017-08-21 DIAGNOSIS — F3160 Bipolar disorder, current episode mixed, unspecified: Secondary | ICD-10-CM | POA: Diagnosis not present

## 2017-08-21 DIAGNOSIS — E782 Mixed hyperlipidemia: Secondary | ICD-10-CM | POA: Diagnosis not present

## 2017-08-21 DIAGNOSIS — Z7182 Exercise counseling: Secondary | ICD-10-CM | POA: Diagnosis not present

## 2017-08-21 DIAGNOSIS — F419 Anxiety disorder, unspecified: Secondary | ICD-10-CM | POA: Diagnosis not present

## 2017-08-21 DIAGNOSIS — F319 Bipolar disorder, unspecified: Secondary | ICD-10-CM | POA: Diagnosis not present

## 2017-08-21 DIAGNOSIS — K219 Gastro-esophageal reflux disease without esophagitis: Secondary | ICD-10-CM | POA: Diagnosis not present

## 2017-08-21 DIAGNOSIS — Z0000 Encounter for general adult medical examination without abnormal findings: Secondary | ICD-10-CM | POA: Diagnosis not present

## 2017-08-21 DIAGNOSIS — Z713 Dietary counseling and surveillance: Secondary | ICD-10-CM | POA: Diagnosis not present

## 2017-08-21 DIAGNOSIS — Z0001 Encounter for general adult medical examination with abnormal findings: Secondary | ICD-10-CM | POA: Diagnosis not present

## 2017-08-21 DIAGNOSIS — Z6830 Body mass index (BMI) 30.0-30.9, adult: Secondary | ICD-10-CM | POA: Diagnosis not present

## 2017-08-23 DIAGNOSIS — E782 Mixed hyperlipidemia: Secondary | ICD-10-CM | POA: Diagnosis not present

## 2017-08-23 DIAGNOSIS — F331 Major depressive disorder, recurrent, moderate: Secondary | ICD-10-CM | POA: Diagnosis not present

## 2017-08-23 DIAGNOSIS — F319 Bipolar disorder, unspecified: Secondary | ICD-10-CM | POA: Diagnosis not present

## 2017-08-23 DIAGNOSIS — F419 Anxiety disorder, unspecified: Secondary | ICD-10-CM | POA: Diagnosis not present

## 2017-09-18 ENCOUNTER — Ambulatory Visit: Payer: Self-pay | Admitting: Psychology

## 2017-11-01 DIAGNOSIS — F319 Bipolar disorder, unspecified: Secondary | ICD-10-CM | POA: Diagnosis not present

## 2017-11-01 DIAGNOSIS — F419 Anxiety disorder, unspecified: Secondary | ICD-10-CM | POA: Diagnosis not present

## 2017-11-01 DIAGNOSIS — Z6829 Body mass index (BMI) 29.0-29.9, adult: Secondary | ICD-10-CM | POA: Diagnosis not present

## 2017-11-16 DIAGNOSIS — G894 Chronic pain syndrome: Secondary | ICD-10-CM | POA: Diagnosis not present

## 2017-11-16 DIAGNOSIS — Z0000 Encounter for general adult medical examination without abnormal findings: Secondary | ICD-10-CM | POA: Diagnosis not present

## 2017-11-16 DIAGNOSIS — Z6829 Body mass index (BMI) 29.0-29.9, adult: Secondary | ICD-10-CM | POA: Diagnosis not present

## 2017-11-16 DIAGNOSIS — F319 Bipolar disorder, unspecified: Secondary | ICD-10-CM | POA: Diagnosis not present

## 2017-11-16 DIAGNOSIS — M25569 Pain in unspecified knee: Secondary | ICD-10-CM | POA: Diagnosis not present

## 2017-11-16 DIAGNOSIS — Z0001 Encounter for general adult medical examination with abnormal findings: Secondary | ICD-10-CM | POA: Diagnosis not present

## 2017-11-16 DIAGNOSIS — Z7182 Exercise counseling: Secondary | ICD-10-CM | POA: Diagnosis not present

## 2017-11-16 DIAGNOSIS — F419 Anxiety disorder, unspecified: Secondary | ICD-10-CM | POA: Diagnosis not present

## 2017-11-16 DIAGNOSIS — Z713 Dietary counseling and surveillance: Secondary | ICD-10-CM | POA: Diagnosis not present

## 2017-12-20 DIAGNOSIS — Z720 Tobacco use: Secondary | ICD-10-CM | POA: Diagnosis not present

## 2017-12-20 DIAGNOSIS — F419 Anxiety disorder, unspecified: Secondary | ICD-10-CM | POA: Diagnosis not present

## 2017-12-20 DIAGNOSIS — F319 Bipolar disorder, unspecified: Secondary | ICD-10-CM | POA: Diagnosis not present

## 2018-02-07 DIAGNOSIS — F419 Anxiety disorder, unspecified: Secondary | ICD-10-CM | POA: Diagnosis not present

## 2018-02-07 DIAGNOSIS — F17200 Nicotine dependence, unspecified, uncomplicated: Secondary | ICD-10-CM | POA: Diagnosis not present

## 2018-02-07 DIAGNOSIS — M722 Plantar fascial fibromatosis: Secondary | ICD-10-CM | POA: Diagnosis not present

## 2018-02-07 DIAGNOSIS — F319 Bipolar disorder, unspecified: Secondary | ICD-10-CM | POA: Diagnosis not present

## 2018-02-07 DIAGNOSIS — F17203 Nicotine dependence unspecified, with withdrawal: Secondary | ICD-10-CM | POA: Diagnosis not present

## 2018-02-07 DIAGNOSIS — Z716 Tobacco abuse counseling: Secondary | ICD-10-CM | POA: Diagnosis not present

## 2018-05-27 DIAGNOSIS — Z0000 Encounter for general adult medical examination without abnormal findings: Secondary | ICD-10-CM | POA: Diagnosis not present

## 2018-07-20 ENCOUNTER — Encounter (HOSPITAL_COMMUNITY): Payer: Self-pay | Admitting: Emergency Medicine

## 2018-07-20 ENCOUNTER — Emergency Department (HOSPITAL_COMMUNITY)
Admission: EM | Admit: 2018-07-20 | Discharge: 2018-07-20 | Disposition: A | Discharge status: Home / Self Care | Payer: Medicare HMO | Attending: Emergency Medicine | Admitting: Emergency Medicine

## 2018-07-20 ENCOUNTER — Emergency Department (HOSPITAL_COMMUNITY): Payer: Medicare HMO

## 2018-07-20 ENCOUNTER — Other Ambulatory Visit: Payer: Self-pay

## 2018-07-20 DIAGNOSIS — S62326A Displaced fracture of shaft of fifth metacarpal bone, right hand, initial encounter for closed fracture: Secondary | ICD-10-CM | POA: Diagnosis not present

## 2018-07-20 DIAGNOSIS — S62302A Unspecified fracture of third metacarpal bone, right hand, initial encounter for closed fracture: Secondary | ICD-10-CM | POA: Diagnosis not present

## 2018-07-20 DIAGNOSIS — Y998 Other external cause status: Secondary | ICD-10-CM | POA: Insufficient documentation

## 2018-07-20 DIAGNOSIS — S62322A Displaced fracture of shaft of third metacarpal bone, right hand, initial encounter for closed fracture: Secondary | ICD-10-CM | POA: Insufficient documentation

## 2018-07-20 DIAGNOSIS — S62511A Displaced fracture of proximal phalanx of right thumb, initial encounter for closed fracture: Secondary | ICD-10-CM | POA: Diagnosis not present

## 2018-07-20 DIAGNOSIS — S62325A Displaced fracture of shaft of fourth metacarpal bone, left hand, initial encounter for closed fracture: Secondary | ICD-10-CM | POA: Diagnosis not present

## 2018-07-20 DIAGNOSIS — Y9289 Other specified places as the place of occurrence of the external cause: Secondary | ICD-10-CM | POA: Insufficient documentation

## 2018-07-20 DIAGNOSIS — W230XXA Caught, crushed, jammed, or pinched between moving objects, initial encounter: Secondary | ICD-10-CM | POA: Insufficient documentation

## 2018-07-20 DIAGNOSIS — Y93H3 Activity, building and construction: Secondary | ICD-10-CM | POA: Insufficient documentation

## 2018-07-20 DIAGNOSIS — F17210 Nicotine dependence, cigarettes, uncomplicated: Secondary | ICD-10-CM | POA: Diagnosis not present

## 2018-07-20 DIAGNOSIS — S6991XA Unspecified injury of right wrist, hand and finger(s), initial encounter: Secondary | ICD-10-CM | POA: Diagnosis present

## 2018-07-20 DIAGNOSIS — S62309A Unspecified fracture of unspecified metacarpal bone, initial encounter for closed fracture: Secondary | ICD-10-CM

## 2018-07-20 DIAGNOSIS — S62304A Unspecified fracture of fourth metacarpal bone, right hand, initial encounter for closed fracture: Secondary | ICD-10-CM | POA: Diagnosis not present

## 2018-07-20 MED ORDER — TETANUS-DIPHTH-ACELL PERTUSSIS 5-2.5-18.5 LF-MCG/0.5 IM SUSP
0.5000 mL | Freq: Once | INTRAMUSCULAR | Status: AC
  Administered 2018-07-20: 0.5 mL via INTRAMUSCULAR
  Filled 2018-07-20: qty 0.5

## 2018-07-20 MED ORDER — ACETAMINOPHEN 500 MG PO TABS: 500.0000 mg | ORAL_TABLET | Freq: Four times a day (QID) | ORAL | 0 refills | Status: AC | PRN

## 2018-07-20 MED ORDER — IBUPROFEN 600 MG PO TABS: 600.0000 mg | ORAL_TABLET | Freq: Four times a day (QID) | ORAL | 0 refills | Status: AC | PRN

## 2018-07-20 MED ORDER — HYDROCODONE-ACETAMINOPHEN 5-325 MG PO TABS
1.0000 | ORAL_TABLET | Freq: Once | ORAL | Status: AC
  Administered 2018-07-20: 1 via ORAL
  Filled 2018-07-20: qty 1

## 2018-07-20 MED ORDER — HYDROMORPHONE HCL 1 MG/ML IJ SOLN
0.5000 mg | Freq: Once | INTRAMUSCULAR | Status: AC
  Administered 2018-07-20: 19:00:00 0.5 mg via INTRAMUSCULAR
  Filled 2018-07-20: qty 1

## 2018-07-20 MED ORDER — HYDROCODONE-ACETAMINOPHEN 5-325 MG PO TABS: 1.0000 | ORAL_TABLET | Freq: Four times a day (QID) | ORAL | 0 refills | Status: AC | PRN

## 2018-07-20 NOTE — Discharge Instructions (Addendum)
1. Medications: Alternate 600 mg of ibuprofen and [phone] mg of Tylenol every 3 hours as needed for pain. Do not exceed 4000 mg of Tylenol daily.  Take ibuprofen with food to avoid upset stomach issues.  You can take hydrocodone as needed for severe pain but do not drive, drink alcohol, or operate heavy machinery while taking this medicine as it may make you drowsy.  Be aware it also contains Tylenol. 2. Treatment: rest, ice, elevate frequently.  Keep the splint clean and dry. 3. Follow Up: Please followup with the hand surgeon as recommended.  Call on Monday to set up an appointment for follow-up; Please return to the ER for worsening symptoms or other concerns such as worsening swelling, redness of the skin, fevers, loss of pulses, or loss of feeling

## 2018-07-20 NOTE — ED Triage Notes (Signed)
Pt reports slamming RT hand into a stud on Thursday. Pt c/o continued pain and swelling.

## 2018-07-20 NOTE — ED Provider Notes (Signed)
[facility] EMERGENCY DEPARTMENT Provider Note   CSN: [id number] Arrival date & time: 07/20/18  1754    History   Chief Complaint Chief Complaint  Patient presents with  . Hand Injury    HPI Paul Ferguson is a 43 y.o. male with history of acid reflux, anxiety, migraines presents today for evaluation of acute onset, progressively worsening right hand pain secondary to injury on Thursday 2 days ago.  He reports that he grabbed a wooden stud while working on a wall. He states the stud was not properly secured so it fell out of place and his right hand was crushed between it and another wooden stud.  He reports progressively worsening swelling and pain to the hand.  It is mostly sharp, worsens with palpation or movement.  Denies numbness or tingling.  He reports a wound that has drained some serous fluid after showering.  He has been applying compression with a wrap, applying ice and took a tablet of Percocet with some relief in his symptoms.  Denies fevers.  He is right-hand dominant.     The history is provided by the patient.    Past Medical History:  Diagnosis Date  . Acid reflux   . Anxiety   . Migraines     There are no active problems to display for this patient.   Past Surgical History:  Procedure Laterality Date  . ABDOMINAL SURGERY    . APPENDECTOMY          Home Medications    Prior to Admission medications   Medication Sig Start Date End Date Taking? Authorizing Provider  acetaminophen (TYLENOL) 500 MG tablet Take 1 tablet (500 mg total) by mouth every 6 (six) hours as needed. 07/20/18   Luevenia MaxinFawze, Modest Draeger A, PA-C  ALPRAZolam Prudy Feeler(XANAX) 1 MG tablet  02/15/17   [provider]  HYDROcodone-acetaminophen (NORCO/VICODIN) 5-325 MG tablet Take 1 tablet by mouth every 6 (six) hours as needed for severe pain. 07/20/18   Emara Lichter A, PA-C  ibuprofen (ADVIL) 600 MG tablet Take 1 tablet (600 mg total) by mouth every 6 (six) hours as needed. 07/20/18   Tyannah Sane A,  PA-C  meclizine (ANTIVERT) 25 MG tablet Take 1 tablet (25 mg total) by mouth 3 (three) times daily as needed for dizziness. Patient not taking: Reported on 02/22/2017 08/14/13   Dione BoozeGlick, David, MD  PARoxetine (PAXIL) 20 MG tablet  02/15/17   [provider]  QUEtiapine (SEROQUEL) 25 MG tablet  02/15/17   [provider]    Family History No family history on file.  Social History Social History   Tobacco Use  . Smoking status: Current Every Day Smoker    Packs/day: 0.50    Types: Cigarettes  . Smokeless tobacco: Never Used  Substance Use Topics  . Alcohol use: Yes    Comment: daily; 2-3 beers   . Drug use: Yes    Types: Marijuana    Comment: last use just prior to coming to ED 07/20/18     Allergies   Fish allergy   Review of Systems Review of Systems  Musculoskeletal: Positive for arthralgias and joint swelling.  Skin: Positive for color change.  Neurological: Negative for weakness and numbness.     Physical Exam Updated Vital Signs BP (!) 161/94 (BP Location: Left Arm)   Pulse 90   Temp 98.2 F (36.8 C) (Oral)   Resp 14   Ht 6' (1.829 m)   Wt 101.2 kg   SpO2 97%  BMI 30.24 kg/m   Physical Exam Vitals signs and nursing note reviewed.  Constitutional:      General: He is not in acute distress.    Appearance: He is well-developed.  HENT:     Head: Normocephalic and atraumatic.  Eyes:     General:        Right eye: No discharge.        Left eye: No discharge.     Conjunctiva/sclera: Conjunctivae normal.  Neck:     Vascular: No JVD.     Trachea: No tracheal deviation.  Cardiovascular:     Rate and Rhythm: Normal rate.     Pulses: Normal pulses.     Comments: 2+ radial pulses bilaterally Pulmonary:     Effort: Pulmonary effort is normal.  Abdominal:     General: There is no distension.  Musculoskeletal:     Comments: Swelling and ecchymosis to the dorsum and the palmar aspect of the right hand.  It is worse along the ulnar aspect of  the hand.  He is maximally tender to palpation overlying the right fourth and fifth metatarsals.  He has a superficial wound between the right second and third digits with no surrounding erythema.  No drainage.  It appears to be healing well.  5/5 strength of wrist and digits with flexion and extension against resistance but decreased range of motion due to pain.  Skin:    General: Skin is warm and dry.     Capillary Refill: Capillary refill takes less than 2 seconds.     Findings: No erythema.  Neurological:     Mental Status: He is alert.     Comments: Sensation intact to soft touch of bilateral upper extremities.  Psychiatric:        Behavior: Behavior normal.      ED Treatments / Results  Labs (all labs ordered are listed, but only abnormal results are displayed) Labs Reviewed - No data to display  EKG None  Radiology Dg Hand Complete Right  Result Date: 07/20/2018 CLINICAL DATA:  Pain and swelling EXAM: RIGHT HAND - COMPLETE 3+ VIEW COMPARISON:  None. FINDINGS: Acute mildly displaced fractures involving the proximal shafts of the third, fourth, and fifth metacarpals, with mild volar angulation of distal fracture fragments. No subluxation. IMPRESSION: Acute mildly displaced and angulated fractures involving the proximal shafts of the third through fifth metacarpals Electronically Signed   By: Donavan Foil M.D.   On: 07/20/2018 18:51    Procedures Procedures (including critical care time)  Medications Ordered in ED Medications  HYDROmorphone (DILAUDID) injection 0.5 mg (0.5 mg Intramuscular Given 07/20/18 1848)  Tdap (BOOSTRIX) injection 0.5 mL (0.5 mLs Intramuscular Given 07/20/18 1852)  HYDROcodone-acetaminophen (NORCO/VICODIN) 5-325 MG per tablet 1 tablet (1 tablet Oral Given 07/20/18 1942)     Initial Impression / Assessment and Plan / ED Course  I have reviewed the triage vital signs and the nursing notes.  Pertinent labs & imaging results that were available during my  care of the patient were reviewed by me and considered in my medical decision making (see chart for details).        Patient presenting for evaluation of progressively worsening pain and swelling secondary to injury on Thursday.  He is afebrile, initially somewhat hypertensive but I suspect this is in response to pain.  He is neurovascularly intact.  Radiographs show acute mildly displaced and angulated fractures involving the proximal shaft of the third through fifth metacarpals.  Pain controlled while in  the ED.  I spoke with Dr. Izora Ribasoley with hand surgery in consultation who recommends a volar splint and will see him in the office this week.  The patient understands to call the office on Monday to set up an appointment.  No concern for secondary skin infection, DVT, or flexor tenosynovitis.  Discussed pain control with ice, compression, elevation.  Will discharge with course of hydrocodone, advised of appropriate use of these medications.  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  Discussed strict ED return precautions. Patient verbalized understanding of and agreement with plan and is safe for discharge home at this time.   Final Clinical Impressions(s) / ED Diagnoses   Final diagnoses:  Closed fracture of multiple metacarpal bones, initial encounter    ED Discharge Orders         Ordered    HYDROcodone-acetaminophen (NORCO/VICODIN) 5-325 MG tablet  Every 6 hours PRN     07/20/18 1958    acetaminophen (TYLENOL) 500 MG tablet  Every 6 hours PRN     07/20/18 1958    ibuprofen (ADVIL) 600 MG tablet  Every 6 hours PRN     07/20/18 1958           Jeanie SewerFawze, Rayshaun Needle A, PA-C 07/20/18 Michel Santee2000    Zackowski, Scott, MD 07/21/18 1842

## 2018-07-20 NOTE — ED Notes (Signed)
Dr. Lenon Curt transferred to [phone]

## 2018-07-23 DIAGNOSIS — M79641 Pain in right hand: Secondary | ICD-10-CM | POA: Diagnosis not present

## 2018-07-23 DIAGNOSIS — S6291XA Unspecified fracture of right wrist and hand, initial encounter for closed fracture: Secondary | ICD-10-CM | POA: Diagnosis not present

## 2018-07-30 ENCOUNTER — Encounter (HOSPITAL_COMMUNITY): Payer: Self-pay | Admitting: *Deleted

## 2018-07-30 ENCOUNTER — Emergency Department (HOSPITAL_COMMUNITY): Payer: Medicare HMO

## 2018-07-30 ENCOUNTER — Other Ambulatory Visit: Payer: Self-pay

## 2018-07-30 ENCOUNTER — Emergency Department (HOSPITAL_COMMUNITY)
Admission: EM | Admit: 2018-07-30 | Discharge: 2018-07-30 | Disposition: A | Discharge status: Home / Self Care | Payer: Medicare HMO | Attending: Emergency Medicine | Admitting: Emergency Medicine

## 2018-07-30 DIAGNOSIS — W228XXA Striking against or struck by other objects, initial encounter: Secondary | ICD-10-CM | POA: Insufficient documentation

## 2018-07-30 DIAGNOSIS — Z79899 Other long term (current) drug therapy: Secondary | ICD-10-CM | POA: Diagnosis not present

## 2018-07-30 DIAGNOSIS — F1210 Cannabis abuse, uncomplicated: Secondary | ICD-10-CM | POA: Insufficient documentation

## 2018-07-30 DIAGNOSIS — S62312D Displaced fracture of base of third metacarpal bone, right hand, subsequent encounter for fracture with routine healing: Secondary | ICD-10-CM | POA: Diagnosis not present

## 2018-07-30 DIAGNOSIS — Y998 Other external cause status: Secondary | ICD-10-CM | POA: Insufficient documentation

## 2018-07-30 DIAGNOSIS — S62337A Displaced fracture of neck of fifth metacarpal bone, left hand, initial encounter for closed fracture: Secondary | ICD-10-CM | POA: Insufficient documentation

## 2018-07-30 DIAGNOSIS — S62306D Unspecified fracture of fifth metacarpal bone, right hand, subsequent encounter for fracture with routine healing: Secondary | ICD-10-CM | POA: Insufficient documentation

## 2018-07-30 DIAGNOSIS — S62316A Displaced fracture of base of fifth metacarpal bone, right hand, initial encounter for closed fracture: Secondary | ICD-10-CM | POA: Diagnosis not present

## 2018-07-30 DIAGNOSIS — Y9389 Activity, other specified: Secondary | ICD-10-CM | POA: Insufficient documentation

## 2018-07-30 DIAGNOSIS — F17210 Nicotine dependence, cigarettes, uncomplicated: Secondary | ICD-10-CM | POA: Diagnosis not present

## 2018-07-30 DIAGNOSIS — S62339A Displaced fracture of neck of unspecified metacarpal bone, initial encounter for closed fracture: Secondary | ICD-10-CM

## 2018-07-30 DIAGNOSIS — S62316D Displaced fracture of base of fifth metacarpal bone, right hand, subsequent encounter for fracture with routine healing: Secondary | ICD-10-CM | POA: Diagnosis not present

## 2018-07-30 DIAGNOSIS — S62309D Unspecified fracture of unspecified metacarpal bone, subsequent encounter for fracture with routine healing: Secondary | ICD-10-CM

## 2018-07-30 DIAGNOSIS — S6992XA Unspecified injury of left wrist, hand and finger(s), initial encounter: Secondary | ICD-10-CM | POA: Diagnosis present

## 2018-07-30 DIAGNOSIS — Y929 Unspecified place or not applicable: Secondary | ICD-10-CM | POA: Diagnosis not present

## 2018-07-30 DIAGNOSIS — S62314D Displaced fracture of base of fourth metacarpal bone, right hand, subsequent encounter for fracture with routine healing: Secondary | ICD-10-CM | POA: Diagnosis not present

## 2018-07-30 HISTORY — DX: Bipolar disorder, unspecified: F31.9

## 2018-07-30 MED ORDER — OXYCODONE-ACETAMINOPHEN 5-325 MG PO TABS
1.0000 | ORAL_TABLET | Freq: Once | ORAL | Status: AC
  Administered 2018-07-30: 1 via ORAL
  Filled 2018-07-30: qty 1

## 2018-07-30 MED ORDER — KETOROLAC TROMETHAMINE 30 MG/ML IJ SOLN
30.0000 mg | Freq: Once | INTRAMUSCULAR | Status: AC
  Administered 2018-07-30: 30 mg via INTRAMUSCULAR
  Filled 2018-07-30: qty 1

## 2018-07-30 NOTE — Discharge Instructions (Addendum)
Wear the splints at all times to protect your injuries.  You need to followup with a hand specialist as discussed.  You were referred to Dr. Lenon Curt at your last visit here and he is the recommended MD who agreed to see you for ongoing care.  Call for this follow up as soon as possible.  Ice and elevation will help with the pain and swelling of your new injury.
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
## 2018-07-31 NOTE — ED Provider Notes (Signed)
[facility] EMERGENCY DEPARTMENT Provider Note   CSN: [id number] Arrival date & time: 07/30/18  1213     History   Chief Complaint Chief Complaint  Patient presents with  . Hand Injury    HPI Paul Ferguson is a 43 y.o. male with a history as outlined below and most significant for right hand metacarpal fractures diagnosed here 10 days ago, presenting in police custody with complaint of new left hand pain and swelling from injury sustained when he punched a wall prior to arrival.  He denies numbness in his hand or fingers and has no skin injury but moderate pain and swelling.  He is not currently wearing the right splint because he did not like it.  He has called Dr. Debby Budoley's office for f/u care, but has not yet been seen by him.  He has had no treatment for his new injury prior to arrival.  He denies pain in the left wrist or forearm.    The history is provided by the patient.    Past Medical History:  Diagnosis Date  . Acid reflux   . Anxiety   . Bipolar disorder (HCC)   . Migraines     There are no active problems to display for this patient.   Past Surgical History:  Procedure Laterality Date  . ABDOMINAL SURGERY    . APPENDECTOMY          Home Medications    Prior to Admission medications   Medication Sig Start Date End Date Taking? Authorizing Provider  acetaminophen (TYLENOL) 500 MG tablet Take 1 tablet (500 mg total) by mouth every 6 (six) hours as needed. 07/20/18   Luevenia MaxinFawze, Mina A, PA-C  ALPRAZolam Prudy Feeler(XANAX) 1 MG tablet  02/15/17   [provider]  HYDROcodone-acetaminophen (NORCO/VICODIN) 5-325 MG tablet Take 1 tablet by mouth every 6 (six) hours as needed for severe pain. 07/20/18   Fawze, Mina A, PA-C  ibuprofen (ADVIL) 600 MG tablet Take 1 tablet (600 mg total) by mouth every 6 (six) hours as needed. 07/20/18   Fawze, Mina A, PA-C  meclizine (ANTIVERT) 25 MG tablet Take 1 tablet (25 mg total) by mouth 3 (three) times daily as needed for dizziness.  Patient not taking: Reported on 02/22/2017 08/14/13   Dione BoozeGlick, David, MD  PARoxetine (PAXIL) 20 MG tablet  02/15/17   [provider]  QUEtiapine (SEROQUEL) 25 MG tablet  02/15/17   [provider]    Family History No family history on file.  Social History Social History   Tobacco Use  . Smoking status: Current Every Day Smoker    Packs/day: 1.00    Types: Cigarettes  . Smokeless tobacco: Never Used  Substance Use Topics  . Alcohol use: Yes    Comment: 6 pack of beer daily   . Drug use: Yes    Types: Marijuana     Allergies   Fish allergy   Review of Systems Review of Systems  Constitutional: Negative for fever.  Musculoskeletal: Positive for arthralgias and joint swelling. Negative for myalgias.  Neurological: Negative for weakness and numbness.     Physical Exam Updated Vital Signs BP 128/69   Pulse 87   Temp 98.1 F (36.7 C) (Oral)   Resp 16   Ht 6' (1.829 m)   Wt 98.4 kg   SpO2 99%   BMI 29.43 kg/m   Physical Exam Constitutional:      Appearance: He is well-developed.  HENT:     Head: Atraumatic.  Neck:     Musculoskeletal: Normal range of motion.  Cardiovascular:     Comments: Pulses equal bilaterally Musculoskeletal:        General: Tenderness present.     Right hand: He exhibits tenderness. He exhibits normal capillary refill, no deformity and no swelling.     Left hand: He exhibits decreased range of motion, tenderness, bony tenderness and swelling. He exhibits normal two-point discrimination, normal capillary refill, no deformity and no laceration. He exhibits no finger abduction and no wrist extension trouble.       Hands:  Skin:    General: Skin is warm and dry.  Neurological:     Mental Status: He is alert.     Sensory: No sensory deficit.     Deep Tendon Reflexes: Reflexes normal.      ED Treatments / Results  Labs (all labs ordered are listed, but only abnormal results are displayed) Labs Reviewed - No data to  display  EKG None  Radiology Dg Hand Complete Left  Result Date: 07/30/2018 CLINICAL DATA:  Struck a wall today. EXAM: LEFT HAND - COMPLETE 3+ VIEW COMPARISON:  None. FINDINGS: There is a comminuted boxer's type fracture involving the fifth metacarpal neck with mild palmar angulation. The other metacarpal bones are intact. The joint spaces are maintained. No wrist fracture is identified. IMPRESSION: Fifth metacarpal neck fracture (boxer's type fracture). Electronically Signed   By: Marijo Sanes M.D.   On: 07/30/2018 13:18   Dg Hand Complete Right  Result Date: 07/30/2018 CLINICAL DATA:  Followup hand fractures. EXAM: RIGHT HAND - COMPLETE 3+ VIEW COMPARISON:  07/20/2018 FINDINGS: Stable position and alignment of the third through fifth metacarpal base fractures. The fourth metacarpal fracture is slightly displaced and angulated dorsally. The joint spaces are maintained. No other fractures are identified. IMPRESSION: Stable appearing third through fifth metacarpal base fractures. Electronically Signed   By: Marijo Sanes M.D.   On: 07/30/2018 13:20    Procedures Procedures (including critical care time)  SPLINT APPLICATION Date/Time: 3:01 PM Authorized by: Evalee Jefferson Consent: Verbal consent obtained. Risks and benefits: risks, benefits and alternatives were discussed Consent given by: patient Splint applied by: RN and tech Location details: left hand ulnar gutter, right hand volar hand/wrist Splint type: fiberglass Supplies used: fiberglass, webril, ace wraps Post-procedure: The splinted body part was neurovascularly unchanged following the procedure. Patient tolerance: Patient tolerated the procedure well with no immediate complications.     Medications Ordered in ED Medications  oxyCODONE-acetaminophen (PERCOCET/ROXICET) 5-325 MG per tablet 1 tablet (1 tablet Oral Given 07/30/18 1402)  oxyCODONE-acetaminophen (PERCOCET/ROXICET) 5-325 MG per tablet 1 tablet (1 tablet Oral Given  07/30/18 1403)  ketorolac (TORADOL) 30 MG/ML injection 30 mg (30 mg Intramuscular Given 07/30/18 1403)     Initial Impression / Assessment and Plan / ED Course  I have reviewed the triage vital signs and the nursing notes.  Pertinent labs & imaging results that were available during my care of the patient were reviewed by me and considered in my medical decision making (see chart for details).        Films reviewed and discussed with pt.  Stressed importance of maintaining splints and immobilizing fractures.  Ulnar gutter applied left, replaced volar splint right per Dr. Brennan Bailey original request at prior visit.  Advised ice, elevation, f/u Dr. Lenon Curt.  Return precautions outlined.  Final Clinical Impressions(s) / ED Diagnoses   Final diagnoses:  Closed boxer's fracture, initial encounter  Closed fracture of multiple metacarpal bones with  routine healing, subsequent encounter    ED Discharge Orders    None       Victoriano Laindol, Yao [place]pp[place], PA-C 07/31/18 1417    Samuel JesterMcManus, Kathleen, DO 08/03/18 [phone]

## 2018-08-01 DIAGNOSIS — S62307A Unspecified fracture of fifth metacarpal bone, left hand, initial encounter for closed fracture: Secondary | ICD-10-CM | POA: Diagnosis not present

## 2018-08-01 DIAGNOSIS — S62304A Unspecified fracture of fourth metacarpal bone, right hand, initial encounter for closed fracture: Secondary | ICD-10-CM | POA: Diagnosis not present

## 2018-08-01 DIAGNOSIS — S62306A Unspecified fracture of fifth metacarpal bone, right hand, initial encounter for closed fracture: Secondary | ICD-10-CM | POA: Diagnosis not present

## 2018-10-25 DIAGNOSIS — K219 Gastro-esophageal reflux disease without esophagitis: Secondary | ICD-10-CM | POA: Diagnosis not present

## 2018-10-25 DIAGNOSIS — F319 Bipolar disorder, unspecified: Secondary | ICD-10-CM | POA: Diagnosis not present

## 2018-10-25 DIAGNOSIS — F419 Anxiety disorder, unspecified: Secondary | ICD-10-CM | POA: Diagnosis not present

## 2018-10-25 DIAGNOSIS — F331 Major depressive disorder, recurrent, moderate: Secondary | ICD-10-CM | POA: Diagnosis not present

## 2018-10-25 DIAGNOSIS — G894 Chronic pain syndrome: Secondary | ICD-10-CM | POA: Diagnosis not present

## 2018-11-20 ENCOUNTER — Other Ambulatory Visit: Payer: Self-pay

## 2018-11-20 ENCOUNTER — Emergency Department (HOSPITAL_COMMUNITY)
Admission: EM | Admit: 2018-11-20 | Discharge: 2018-11-20 | Discharge status: Against Medical Advice | Payer: Medicare HMO

## 2019-02-10 ENCOUNTER — Emergency Department (HOSPITAL_COMMUNITY)
Admission: EM | Admit: 2019-02-10 | Discharge: 2019-02-10 | Discharge status: Against Medical Advice | Payer: Medicare HMO | Attending: Emergency Medicine | Admitting: Emergency Medicine

## 2019-02-10 ENCOUNTER — Emergency Department (HOSPITAL_COMMUNITY): Payer: Medicare HMO

## 2019-02-10 ENCOUNTER — Other Ambulatory Visit: Payer: Self-pay

## 2019-02-10 ENCOUNTER — Encounter (HOSPITAL_COMMUNITY): Payer: Self-pay | Admitting: Emergency Medicine

## 2019-02-10 DIAGNOSIS — F17210 Nicotine dependence, cigarettes, uncomplicated: Secondary | ICD-10-CM | POA: Diagnosis not present

## 2019-02-10 DIAGNOSIS — R0781 Pleurodynia: Secondary | ICD-10-CM | POA: Insufficient documentation

## 2019-02-10 DIAGNOSIS — Z79899 Other long term (current) drug therapy: Secondary | ICD-10-CM | POA: Diagnosis not present

## 2019-02-10 DIAGNOSIS — R0789 Other chest pain: Secondary | ICD-10-CM | POA: Diagnosis present

## 2019-02-10 HISTORY — DX: Opioid abuse, uncomplicated: F11.10

## 2019-02-10 HISTORY — DX: Alcohol abuse, uncomplicated: F10.10

## 2019-02-10 LAB — CBC WITH DIFFERENTIAL/PLATELET
Abs Immature Granulocytes: 0.02 10*3/uL (ref 0.00–0.07)
Basophils Absolute: 0 10*3/uL (ref 0.0–0.1)
Basophils Relative: 0 %
Eosinophils Absolute: 0.1 10*3/uL (ref 0.0–0.5)
Eosinophils Relative: 1 %
HCT: 41 % (ref 39.0–52.0)
Hemoglobin: 13.4 g/dL (ref 13.0–17.0)
Immature Granulocytes: 0 %
Lymphocytes Relative: 28 %
Lymphs Abs: 2.1 10*3/uL (ref 0.7–4.0)
MCH: 30.7 pg (ref 26.0–34.0)
MCHC: 32.7 g/dL (ref 30.0–36.0)
MCV: 94 fL (ref 80.0–100.0)
Monocytes Absolute: 0.5 10*3/uL (ref 0.1–1.0)
Monocytes Relative: 6 %
Neutro Abs: 4.8 10*3/uL (ref 1.7–7.7)
Neutrophils Relative %: 65 %
Platelets: 195 10*3/uL (ref 150–400)
RBC: 4.36 MIL/uL (ref 4.22–5.81)
RDW: 12.7 % (ref 11.5–15.5)
WBC: 7.4 10*3/uL (ref 4.0–10.5)
nRBC: 0 % (ref 0.0–0.2)

## 2019-02-10 LAB — COMPREHENSIVE METABOLIC PANEL
ALT: 26 U/L (ref 0–44)
AST: 32 U/L (ref 15–41)
Albumin: 4.2 g/dL (ref 3.5–5.0)
Alkaline Phosphatase: 45 U/L (ref 38–126)
Anion gap: 8 (ref 5–15)
BUN: 16 mg/dL (ref 6–20)
CO2: 22 mmol/L (ref 22–32)
Calcium: 9 mg/dL (ref 8.9–10.3)
Chloride: 109 mmol/L (ref 98–111)
Creatinine, Ser: 1.02 mg/dL (ref 0.61–1.24)
GFR calc Af Amer: >60 mL/min (ref >60)
GFR calc non Af Amer: >60 mL/min (ref >60)
Glucose, Bld: 93 mg/dL (ref 70–99)
Potassium: 4.1 mmol/L (ref 3.5–5.1)
Sodium: 139 mmol/L (ref 135–145)
Total Bilirubin: 0.5 mg/dL (ref 0.3–1.2)
Total Protein: 7.3 g/dL (ref 6.5–8.1)

## 2019-02-10 LAB — URINALYSIS, ROUTINE W REFLEX MICROSCOPIC
Bilirubin Urine: NEGATIVE
Glucose, UA: NEGATIVE mg/dL
Hgb urine dipstick: NEGATIVE
Ketones, ur: NEGATIVE mg/dL
Leukocytes,Ua: NEGATIVE
Nitrite: NEGATIVE
Protein, ur: NEGATIVE mg/dL
Specific Gravity, Urine: 1.001 — ABNORMAL LOW (ref 1.005–1.030)
pH: 6 (ref 5.0–8.0)

## 2019-02-10 LAB — LIPASE, BLOOD: Lipase: 25 U/L (ref 11–51)

## 2019-02-10 MED ORDER — KETOROLAC TROMETHAMINE 30 MG/ML IJ SOLN: 30.0000 mg | Freq: Once | INTRAMUSCULAR | Status: DC

## 2019-02-10 MED ORDER — LIDOCAINE 5 % EX PTCH
1.0000 | MEDICATED_PATCH | Freq: Once | CUTANEOUS | Status: DC
  Administered 2019-02-10: 1 via TRANSDERMAL
  Filled 2019-02-10: qty 1

## 2019-02-10 NOTE — ED Triage Notes (Signed)
Pt Dr Margo Aye  L rib pain for 4 months  Buying Oxycodone off of the street   Has not sought PCP care

## 2019-02-10 NOTE — Discharge Instructions (Addendum)
You have been seen today for rib pain. Please read and follow all provided instructions. Return to the emergency room for worsening condition or new concerning symptoms.    X-ray and lab work today were all normal.  1. Medications:  Continue usual home medications Take medications as prescribed. Please review all of the medicines and only take them if you do not have an allergy to them.   2. Treatment: rest, drink plenty of fluids  3. Follow Up:  Please follow up with primary care provider by scheduling an appointment as soon as possible for a visit.    It is also a possibility that you have an allergic reaction to any of the medicines that you have been prescribed - Everybody reacts differently to medications and while MOST people have no trouble with most medicines, you may have a reaction such as nausea, vomiting, rash, swelling, shortness of breath. If this is the case, please stop taking the medicine immediately and contact your physician.  ?

## 2019-02-10 NOTE — ED Provider Notes (Addendum)
[facility] EMERGENCY DEPARTMENT Provider Note   CSN: [id number] Arrival date & time: 02/10/19  1317     History Chief Complaint  Patient presents with  . Chest Pain    Paul Ferguson is a 44 y.o. male with past medical history significant for acid reflux, anxiety, bipolar disorder presents to emergency room today with chief complaint of left rib pain x4 months.  Pain has been progressively worsening over the last 2 weeks.  He states the pain is located underneath his left rib cage.  He states the pain radiates through to his back.  He describes the pain as sharp.  He rates the pain 7 out of 10 in severity.  He has not been evaluated for this pain prior to arrival.   He is also endorsing nausea and nonbilious nonbloody emesis and diarrhea that he describes as loose stool. He denies any blood in stool and describes stool as loose and brown. Patient states he drinks alcohol daily, estimating a 6 or 12 pack daily for the last 6 to 7 years.  He states he is recently try to cut back his alcohol intake, his last drink was this morning.  Admits to smoking marijuana denies IV drug use.  He has also been taking pain medication from friends to help with his rib pain.  He states if he takes oxycodone and drinks alcohol the pain is usually more tolerable so that he can function at work. Denies any recent fall, injury or trauma.  He denies any chest pain, shortness of breath, pleuritic pain, lower extremity edema.  Abdominal surgical history includes appendectomy.    Past Medical History:  Diagnosis Date  . Acid reflux   . Alcohol abuse   . Anxiety   . Bipolar disorder ([place])   . Migraines   . Narcotic abuse ([place])     There are no problems to display for this patient.   Past Surgical History:  Procedure Laterality Date  . ABDOMINAL SURGERY    . APPENDECTOMY         No family history on file.  Social History   Tobacco Use  . Smoking status: Current Every Day Smoker    Packs/day: 1.00     Types: Cigarettes  . Smokeless tobacco: Never Used  Substance Use Topics  . Alcohol use: Yes    Comment: 12 pack daily  . Drug use: Yes    Types: Marijuana    Comment: buying oxy off of the street    Home Medications Prior to Admission medications   Medication Sig Start Date End Date Taking? Authorizing Provider  acetaminophen (TYLENOL) 500 MG tablet Take 1 tablet (500 mg total) by mouth every 6 (six) hours as needed. 07/20/18   Nils Flack, Mina A, PA-C  ALPRAZolam Duanne Moron) 1 MG tablet  02/15/17   [provider]  HYDROcodone-acetaminophen (NORCO/VICODIN) 5-325 MG tablet Take 1 tablet by mouth every 6 (six) hours as needed for severe pain. 07/20/18   Fawze, Mina A, PA-C  ibuprofen (ADVIL) 600 MG tablet Take 1 tablet (600 mg total) by mouth every 6 (six) hours as needed. 07/20/18   Fawze, Mina A, PA-C  meclizine (ANTIVERT) 25 MG tablet Take 1 tablet (25 mg total) by mouth 3 (three) times daily as needed for dizziness. Patient not taking: Reported on 5/62/5638 9/37/34   Delora Fuel, MD  PARoxetine (PAXIL) 20 MG tablet  02/15/17   [provider]  QUEtiapine (SEROQUEL) 25 MG tablet  02/15/17   [provider]    Allergies    Fish allergy  Review of Systems   Review of Systems  All other systems are reviewed and are negative for acute change except as noted in the HPI.   Physical Exam Updated Vital Signs BP (!) 157/118 (BP Location: Right Arm)   Pulse 71   Temp 98.2 F (36.8 C) (Oral)   Resp 16   Ht 5\' 11"  (1.803 m)   Wt 93 kg   SpO2 98%   BMI 28.59 kg/m   Physical Exam Vitals and nursing note reviewed.  Constitutional:      General: He is not in acute distress.    Appearance: He is not ill-appearing.  HENT:     Head: Normocephalic and atraumatic.     Right Ear: Tympanic membrane and external ear normal.     Left Ear: Tympanic membrane and external ear normal.     Nose: Nose normal.     Mouth/Throat:     Mouth: Mucous membranes are moist.      Pharynx: Oropharynx is clear.  Eyes:     General: No scleral icterus.       Right eye: No discharge.        Left eye: No discharge.     Extraocular Movements: Extraocular movements intact.     Conjunctiva/sclera: Conjunctivae normal.     Pupils: Pupils are equal, round, and reactive to light.  Neck:     Vascular: No JVD.  Cardiovascular:     Rate and Rhythm: Normal rate and regular rhythm.     Pulses: Normal pulses.          Radial pulses are 2+ on the right side and 2+ on the left side.     Heart sounds: Normal heart sounds.  Pulmonary:     Comments: Lungs clear to auscultation in all fields. Symmetric chest rise. No wheezing, rales, or rhonchi. Chest:     Comments: No anterior chest wall tenderness.  No deformity or crepitus noted.  No evidence of flail chest.  Abdominal:     Tenderness: There is no right CVA tenderness or left CVA tenderness.     Comments: Abdomen is soft, non-distended, and non-tender in all quadrants. No rigidity, no guarding. No peritoneal signs.  Musculoskeletal:        General: Normal range of motion.     Cervical back: Normal range of motion.  Skin:    General: Skin is warm and dry.     Capillary Refill: Capillary refill takes less than 2 seconds.     Comments: No track marks seen on extremities  Neurological:     Mental Status: He is oriented to person, place, and time.     GCS: GCS eye subscore is 4. GCS verbal subscore is 5. GCS motor subscore is 6.     Comments: Fluent speech, no facial droop.  Psychiatric:        Behavior: Behavior normal.     ED Results / Procedures / Treatments   Labs (all labs ordered are listed, but only abnormal results are displayed) Labs Reviewed  URINALYSIS, ROUTINE W REFLEX MICROSCOPIC - Abnormal; Notable for the following components:      Result Value   Color, Urine COLORLESS (*)    Specific Gravity, Urine 1.001 (*)    All other components within normal limits  CBC WITH DIFFERENTIAL/PLATELET  COMPREHENSIVE  METABOLIC PANEL  LIPASE, BLOOD    EKG  Date/Time: 10-Feb-2019    15:14:24 Ventricular rate: 70  bpm PR interval: 142 QRS duration: 82 QT Interval: 386 QTC calculation: 416 R axis: 12 Text interpretation: Normal sinus rhythm, minimal voltage criteria for LVH.  Unchanged compared to prior.  Radiology DG Ribs Unilateral W/Chest Left  Result Date: 02/10/2019 CLINICAL DATA:  Pain for several months EXAM: LEFT RIBS AND CHEST - 3+ VIEW COMPARISON:  Chest radiograph October 06, 2016 FINDINGS: Frontal chest as well as oblique and cone-down rib images were obtained. Lungs are clear. Heart size and pulmonary vascularity are normal. No adenopathy. No evident pneumothorax or pleural effusion. No evident rib fracture. IMPRESSION: No evident rib fracture.  Lungs clear. Electronically Signed   By: Bretta Bang III M.D.   On: 02/10/2019 15:01    Procedures Procedures (including critical care time)  Medications Ordered in ED Medications  lidocaine (LIDODERM) 5 % 1 patch (1 patch Transdermal Patch Applied 02/10/19 1503)  ketorolac (TORADOL) 30 MG/ML injection 30 mg (has no administration in time range)    ED Course  I have reviewed the triage vital signs and the nursing notes.  Pertinent labs & imaging results that were available during my care of the patient were reviewed by me and considered in my medical decision making (see chart for details).    MDM Rules/Calculators/A&P                      Patient seen and examined. Patient presents awake, alert, hemodynamically stable, afebrile, non toxic.  His lungs are clear to auscultation all fields.  No signs of respiratory distress.  His chest and abdomen arenontender.  There is no CVA tenderness.  His rib pain is not reproducible on my exam.  Does not appear intoxicated, he is goal oriented thinking, clear speech, steady gait.  Very low suspicion for ACS given this pain has been present x4 months. Labs are unremarkable including no  leukocytosis, no anemia, no severe electrolyte derangement, no renal insufficiency.  Lipase is within normal range.  UA is without infection.  EKG viewed by me without ischemic changes.  X-ray of chest and left ribs viewed by me without acute findings, no signs of fracture, dislocation, pneumothorax.  Given Lidoderm patch for pain.  On reassessment he reports pain has only minimally improved.  Discussed his reassuring work-up and x-ray.  Will give Toradol for pain.  I was informed by nursing staff that patient has eloped from the department prior to receiving the Toradol. He ambulated out of the department without signs of distress per RN.  Portions of this note were generated with Scientist, clinical (histocompatibility and immunogenetics). Dictation errors may occur despite best attempts at proofreading.   Final Clinical Impression(s) / ED Diagnoses Final diagnoses:  Rib pain    Rx / DC Orders ED Discharge Orders    None       Sherene Sires, PA-C 02/10/19 1619    Maribelle Hopple, Caroleen Hamman, PA-C 02/10/19 1622    Eber Hong, MD 02/10/19 1746

## 2019-02-10 NOTE — ED Triage Notes (Signed)
On further speech   Pt reports last pain med 4 days ago  Also just stopped drinking   Drank 12 pack daily   Last beer this morning   Was taking oxy  With alcohol

## 2019-02-10 NOTE — ED Notes (Signed)
LEFT PRIOR TO RECEIVING INJECTION, INFORMED REGISTRATION HE WAS LEAVING

## 2019-02-11 DIAGNOSIS — F419 Anxiety disorder, unspecified: Secondary | ICD-10-CM | POA: Diagnosis not present

## 2019-02-11 DIAGNOSIS — R1010 Upper abdominal pain, unspecified: Secondary | ICD-10-CM | POA: Diagnosis not present

## 2019-02-11 DIAGNOSIS — F10280 Alcohol dependence with alcohol-induced anxiety disorder: Secondary | ICD-10-CM | POA: Diagnosis not present

## 2019-02-11 DIAGNOSIS — R197 Diarrhea, unspecified: Secondary | ICD-10-CM | POA: Diagnosis not present

## 2019-02-11 DIAGNOSIS — M79643 Pain in unspecified hand: Secondary | ICD-10-CM | POA: Diagnosis not present

## 2019-02-11 DIAGNOSIS — I10 Essential (primary) hypertension: Secondary | ICD-10-CM | POA: Diagnosis not present

## 2019-02-11 DIAGNOSIS — R112 Nausea with vomiting, unspecified: Secondary | ICD-10-CM | POA: Diagnosis not present

## 2019-02-11 DIAGNOSIS — F319 Bipolar disorder, unspecified: Secondary | ICD-10-CM | POA: Diagnosis not present

## 2019-03-25 DIAGNOSIS — H5213 Myopia, bilateral: Secondary | ICD-10-CM | POA: Diagnosis not present

## 2019-03-25 DIAGNOSIS — H524 Presbyopia: Secondary | ICD-10-CM | POA: Diagnosis not present

## 2019-03-25 DIAGNOSIS — H52209 Unspecified astigmatism, unspecified eye: Secondary | ICD-10-CM | POA: Diagnosis not present

## 2019-10-26 ENCOUNTER — Encounter (HOSPITAL_COMMUNITY): Payer: Self-pay | Admitting: *Deleted

## 2019-10-26 ENCOUNTER — Other Ambulatory Visit: Payer: Self-pay

## 2019-10-26 ENCOUNTER — Emergency Department (HOSPITAL_COMMUNITY)
Admission: EM | Admit: 2019-10-26 | Discharge: 2019-10-26 | Disposition: A | Discharge status: Home / Self Care | Payer: Medicare HMO | Attending: Emergency Medicine | Admitting: Emergency Medicine

## 2019-10-26 DIAGNOSIS — R030 Elevated blood-pressure reading, without diagnosis of hypertension: Secondary | ICD-10-CM

## 2019-10-26 DIAGNOSIS — L0231 Cutaneous abscess of buttock: Secondary | ICD-10-CM | POA: Diagnosis not present

## 2019-10-26 DIAGNOSIS — F17210 Nicotine dependence, cigarettes, uncomplicated: Secondary | ICD-10-CM | POA: Insufficient documentation

## 2019-10-26 MED ORDER — LIDOCAINE HCL (PF) 1 % IJ SOLN
5.0000 mL | Freq: Once | INTRAMUSCULAR | Status: AC
  Administered 2019-10-26: 5 mL
  Filled 2019-10-26: qty 5

## 2019-10-26 MED ORDER — CLINDAMYCIN HCL 300 MG PO CAPS: 300.0000 mg | ORAL_CAPSULE | Freq: Three times a day (TID) | ORAL | 0 refills | Status: AC

## 2019-10-26 NOTE — Discharge Instructions (Addendum)
Continue warm compresses. Take antibiotics as prescribed and complete the full course. Recheck with your doctor or follow up with surgery for wound recheck. Return to ER for fever, worsening pain or other concerning symptoms.

## 2019-10-26 NOTE — ED Triage Notes (Signed)
Pt arrived c/o abscess to R buttock x 2-3 weeks with minimal drainage. Pt has tried home remedies without success at home
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
## 2020-05-11 DIAGNOSIS — H52209 Unspecified astigmatism, unspecified eye: Secondary | ICD-10-CM | POA: Diagnosis not present

## 2020-05-11 DIAGNOSIS — H5213 Myopia, bilateral: Secondary | ICD-10-CM | POA: Diagnosis not present

## 2021-08-22 DIAGNOSIS — H6693 Otitis media, unspecified, bilateral: Secondary | ICD-10-CM | POA: Diagnosis not present

## 2021-08-22 DIAGNOSIS — H60393 Other infective otitis externa, bilateral: Secondary | ICD-10-CM | POA: Diagnosis not present

## 2021-11-16 IMAGING — DX DG RIBS W/ CHEST 3+V*L*
5 series · 5 of 5 positions shown · non-contrast
Comparison: Chest radiograph October 06, 2016

CLINICAL DATA: Pain for several months

EXAM:
LEFT RIBS AND CHEST - 3+ VIEW

[chest pa]
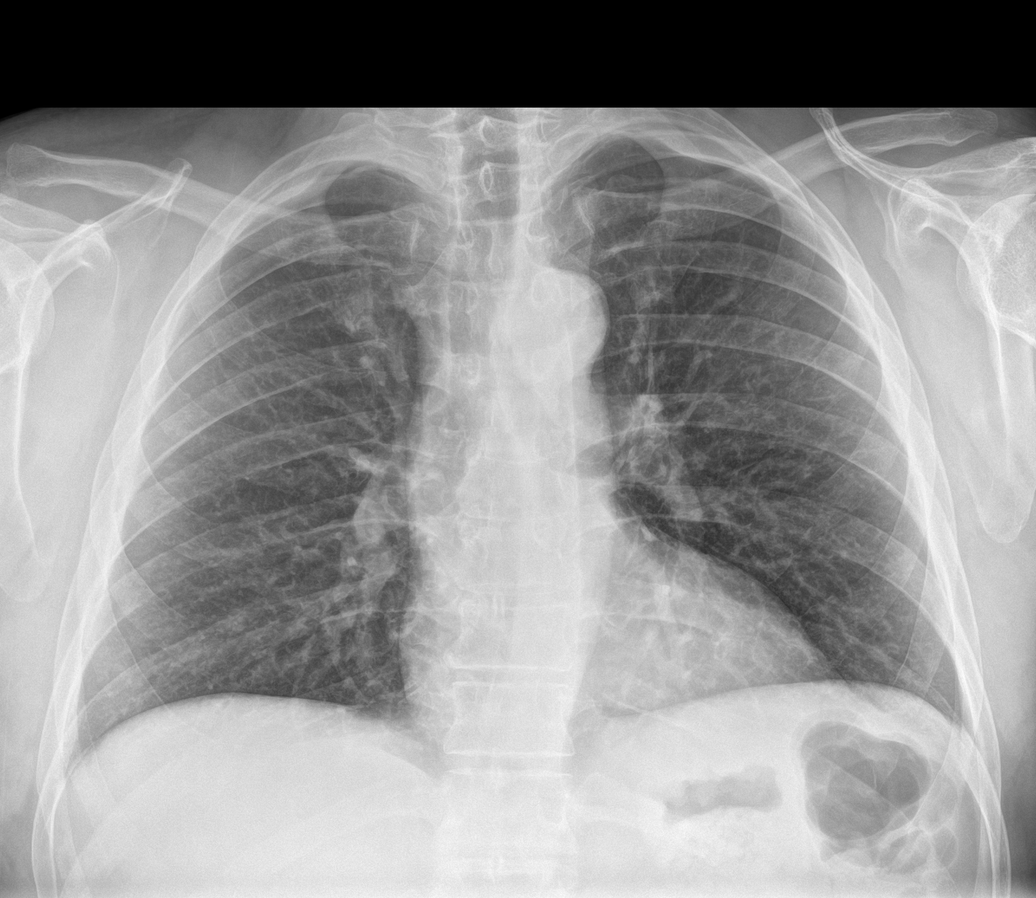

[rib obl (1 of 2)]
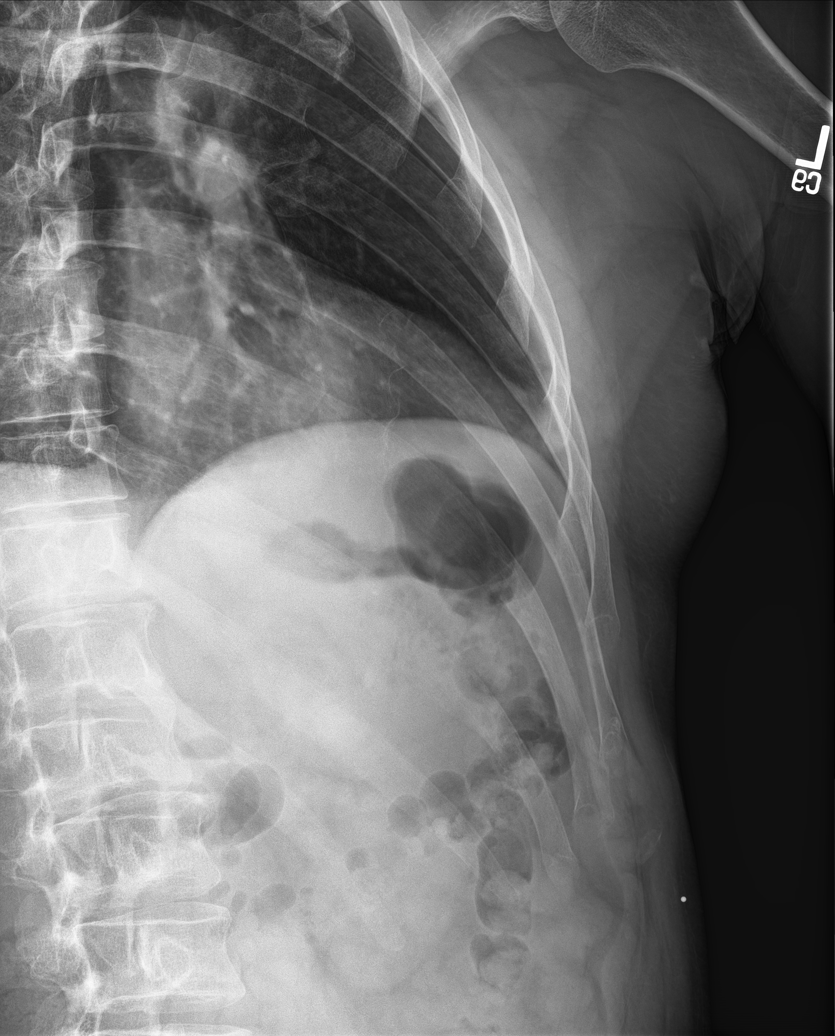

[rib obl (2 of 2)]
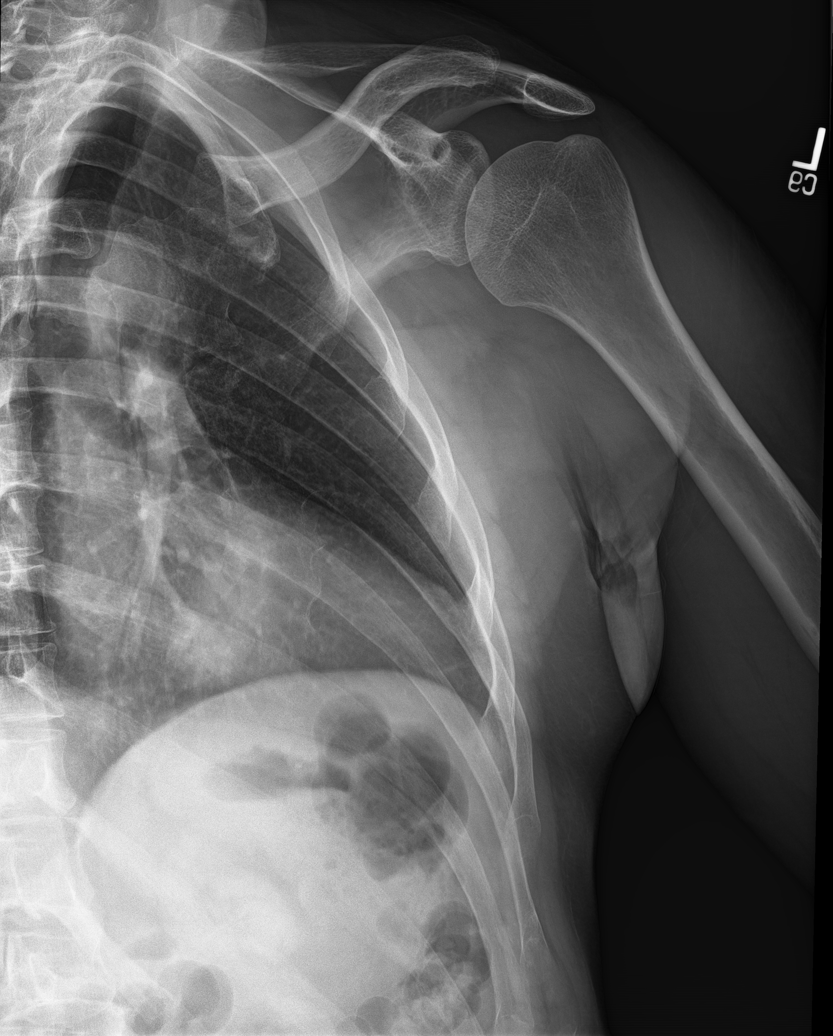

[rib pa (1 of 2)]
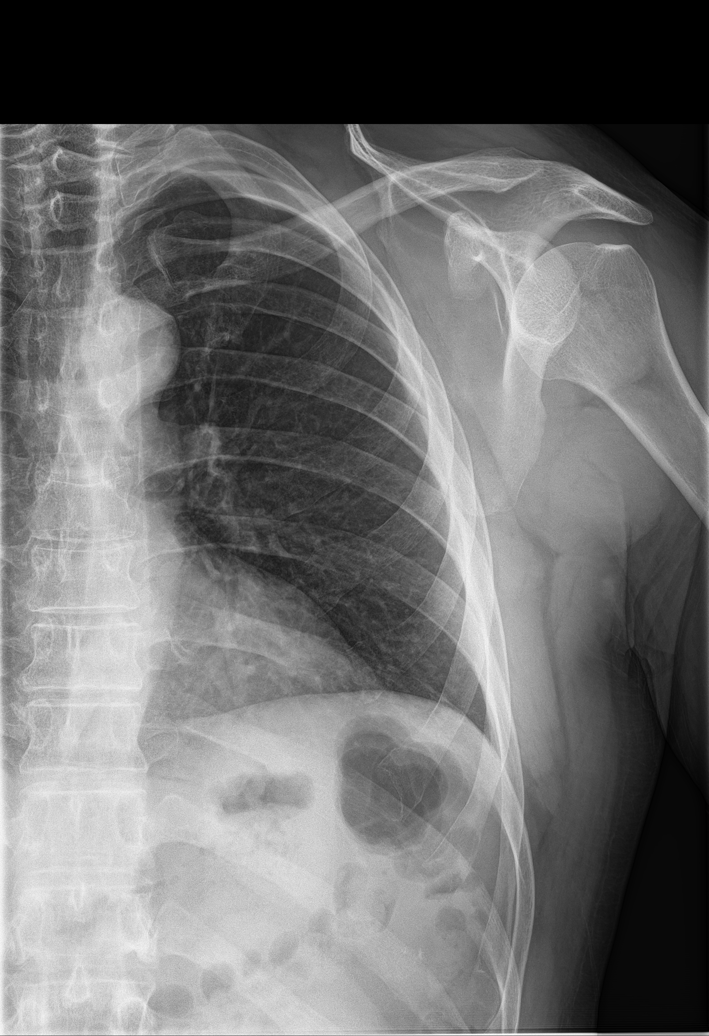

[rib pa (2 of 2)]
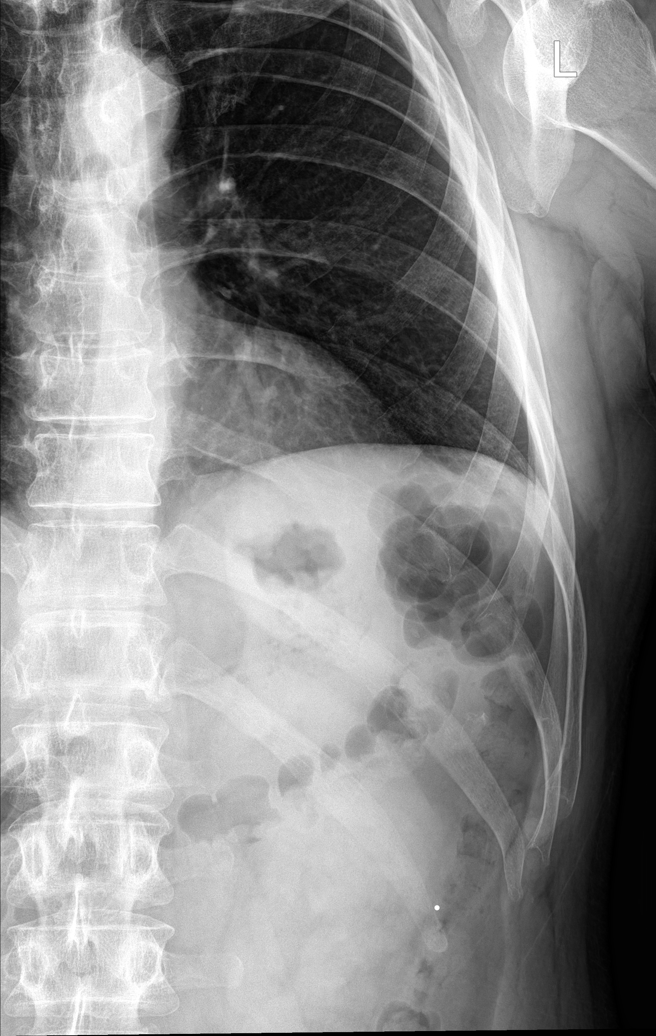

[5 of 5 positions shown; findings below may reference images not displayed]

FINDINGS: Frontal chest as well as oblique and cone-down rib images were
obtained. Lungs are clear. Heart size and pulmonary vascularity are
normal. No adenopathy. No evident pneumothorax or pleural effusion.
No evident rib fracture.
IMPRESSION: No evident rib fracture.  Lungs clear.

## 2023-02-14 DIAGNOSIS — H5213 Myopia, bilateral: Secondary | ICD-10-CM | POA: Diagnosis not present

## 2023-02-14 DIAGNOSIS — H52209 Unspecified astigmatism, unspecified eye: Secondary | ICD-10-CM | POA: Diagnosis not present

## 2023-02-27 DIAGNOSIS — R079 Chest pain, unspecified: Secondary | ICD-10-CM | POA: Diagnosis not present

## 2023-02-27 DIAGNOSIS — R0602 Shortness of breath: Secondary | ICD-10-CM | POA: Diagnosis not present

## 2023-03-03 DIAGNOSIS — F1210 Cannabis abuse, uncomplicated: Secondary | ICD-10-CM | POA: Diagnosis not present

## 2023-03-03 DIAGNOSIS — Z720 Tobacco use: Secondary | ICD-10-CM | POA: Diagnosis not present

## 2023-03-03 DIAGNOSIS — R0789 Other chest pain: Secondary | ICD-10-CM | POA: Diagnosis not present

## 2023-03-03 DIAGNOSIS — R42 Dizziness and giddiness: Secondary | ICD-10-CM | POA: Diagnosis not present

## 2023-03-03 DIAGNOSIS — F319 Bipolar disorder, unspecified: Secondary | ICD-10-CM | POA: Diagnosis not present

## 2023-03-03 DIAGNOSIS — I10 Essential (primary) hypertension: Secondary | ICD-10-CM | POA: Diagnosis not present

## 2023-03-03 DIAGNOSIS — R490 Dysphonia: Secondary | ICD-10-CM | POA: Diagnosis not present

## 2023-03-03 DIAGNOSIS — F17210 Nicotine dependence, cigarettes, uncomplicated: Secondary | ICD-10-CM | POA: Diagnosis not present
